# Patient Record
Sex: Male | Born: 1975 | Race: White | Hispanic: No | Smoking: Current every day smoker
Health system: Southern US, Community
[De-identification: ages and names within clinical notes are randomized; demographics above are authoritative.]

## PROBLEM LIST (undated history)

## (undated) DIAGNOSIS — Z8489 Family history of other specified conditions: Secondary | ICD-10-CM

## (undated) DIAGNOSIS — I639 Cerebral infarction, unspecified: Secondary | ICD-10-CM

## (undated) DIAGNOSIS — K5790 Diverticulosis of intestine, part unspecified, without perforation or abscess without bleeding: Secondary | ICD-10-CM

## (undated) DIAGNOSIS — G4730 Sleep apnea, unspecified: Secondary | ICD-10-CM

## (undated) DIAGNOSIS — I10 Essential (primary) hypertension: Secondary | ICD-10-CM

## (undated) DIAGNOSIS — N200 Calculus of kidney: Secondary | ICD-10-CM

## (undated) DIAGNOSIS — E119 Type 2 diabetes mellitus without complications: Secondary | ICD-10-CM

## (undated) HISTORY — PX: OTHER SURGICAL HISTORY: SHX169

---

## 2010-10-07 ENCOUNTER — Emergency Department (HOSPITAL_COMMUNITY)
Admission: EM | Admit: 2010-10-07 | Discharge: 2010-10-08 | Disposition: A | Discharge status: Home / Self Care | Payer: BC Managed Care – PPO | Attending: Emergency Medicine | Admitting: Emergency Medicine

## 2010-10-07 ENCOUNTER — Encounter: Payer: Self-pay | Admitting: *Deleted

## 2010-10-07 DIAGNOSIS — N200 Calculus of kidney: Secondary | ICD-10-CM | POA: Insufficient documentation

## 2010-10-07 DIAGNOSIS — F17200 Nicotine dependence, unspecified, uncomplicated: Secondary | ICD-10-CM | POA: Insufficient documentation

## 2010-10-07 DIAGNOSIS — R109 Unspecified abdominal pain: Secondary | ICD-10-CM | POA: Insufficient documentation

## 2010-10-07 DIAGNOSIS — N390 Urinary tract infection, site not specified: Secondary | ICD-10-CM | POA: Insufficient documentation

## 2010-10-07 DIAGNOSIS — R319 Hematuria, unspecified: Secondary | ICD-10-CM | POA: Insufficient documentation

## 2010-10-07 HISTORY — DX: Essential (primary) hypertension: I10

## 2010-10-07 LAB — URINALYSIS, ROUTINE W REFLEX MICROSCOPIC
Glucose, UA: NEGATIVE mg/dL
Specific Gravity, Urine: >1.03 — ABNORMAL HIGH (ref 1.005–1.030)

## 2010-10-07 LAB — URINE MICROSCOPIC-ADD ON

## 2010-10-07 NOTE — ED Notes (Signed)
C/o lower back pain x 1 week; reports onset of nausea, worsening right flank pain and hematuria today.

## 2010-10-08 ENCOUNTER — Emergency Department (HOSPITAL_COMMUNITY): Payer: BC Managed Care – PPO

## 2010-10-08 LAB — BASIC METABOLIC PANEL
Calcium: 9.3 mg/dL (ref 8.4–10.5)
GFR calc non Af Amer: >90 mL/min (ref >90)
Glucose, Bld: 103 mg/dL — ABNORMAL HIGH (ref 70–99)
Sodium: 139 mEq/L (ref 135–145)

## 2010-10-08 IMAGING — CT CT ABD-PELV W/O CM
2 of 4 series · 16 of 46 positions shown, 18 images · non-contrast
Comparison: None.

CLINICAL DATA: Flank pain, hematuria

CT ABDOMEN AND PELVIS WITHOUT CONTRAST
TECHNIQUE: Multidetector CT imaging of the abdomen and pelvis was
performed following the standard protocol without intravenous
contrast.

[Series 2: standard/full over (age)lbs 5.0 · axial · 0.90mm/px · z∈[-550,-120]mm · 13 of 94 slices shown, 15 images]
[im 4/94  soft-tissue]
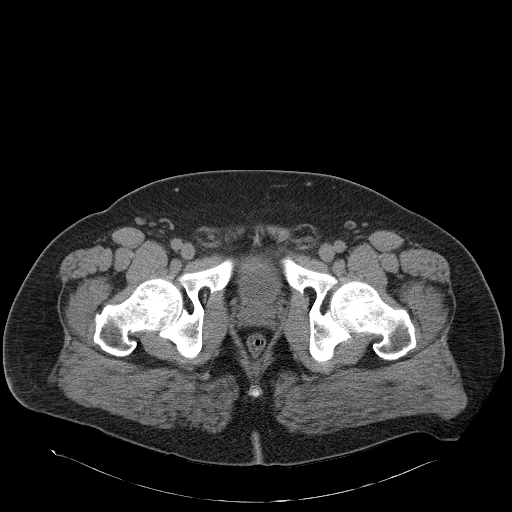
[im 4/94  bone]
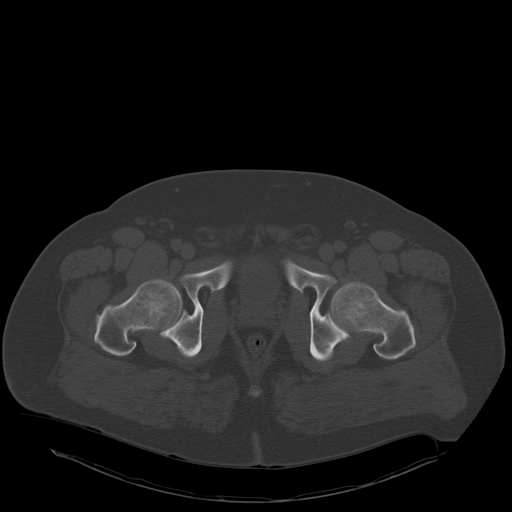
[im 12/94  soft-tissue]
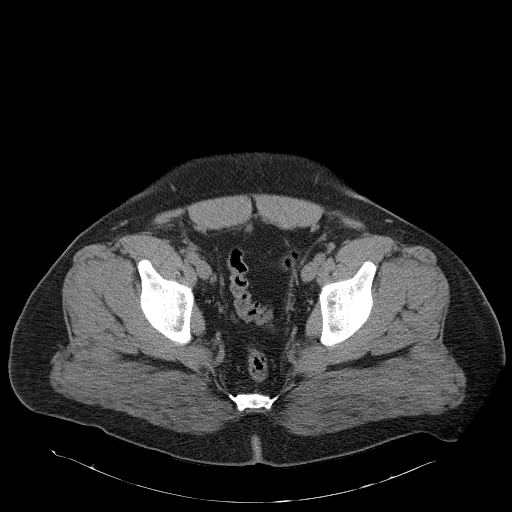
[im 19/94  soft-tissue]
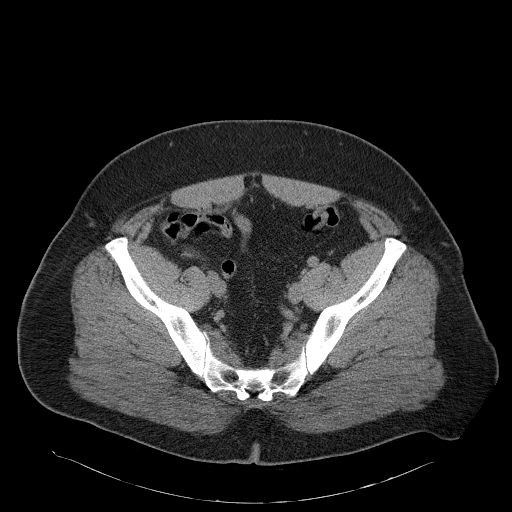
[im 27/94  soft-tissue]
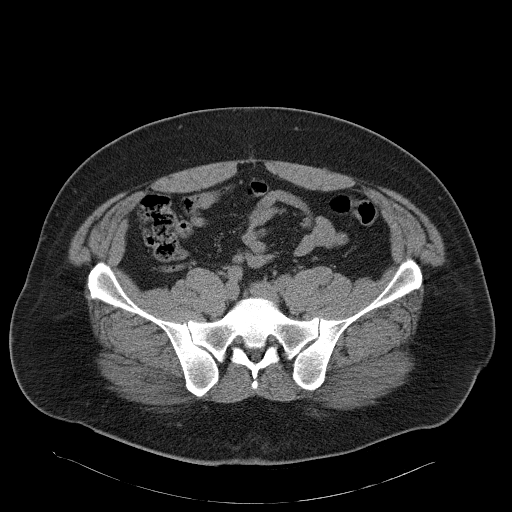
[im 34/94  soft-tissue]
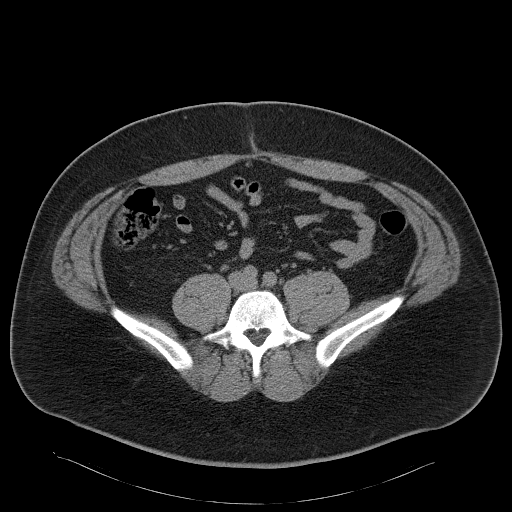
[im 41/94  soft-tissue]
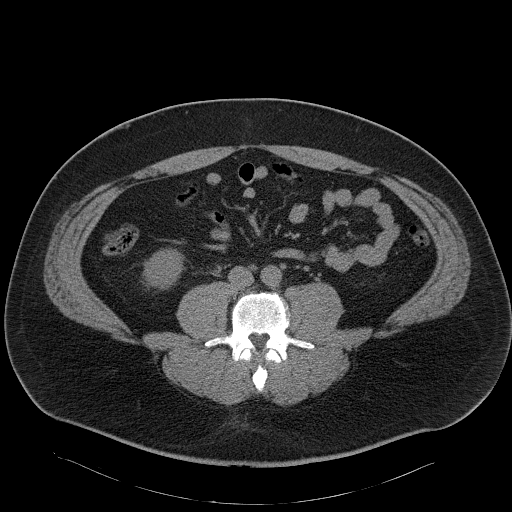
[im 49/94  soft-tissue]
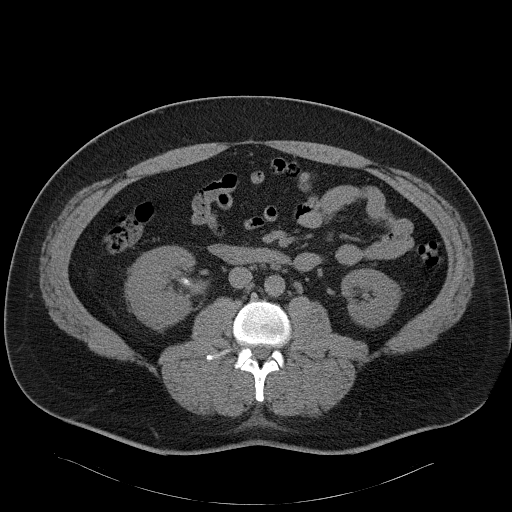
[im 53/94  soft-tissue]
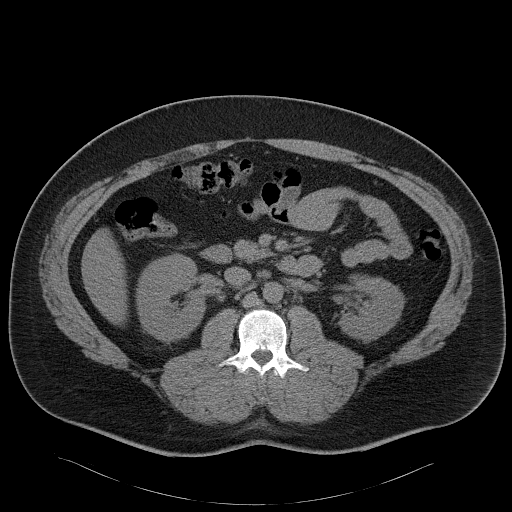
[im 60/94  soft-tissue]
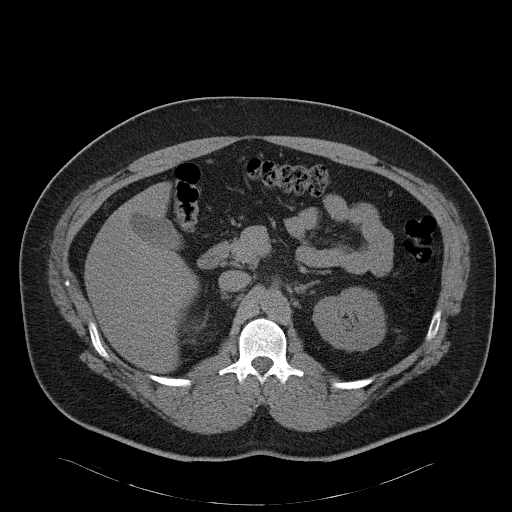
[im 60/94  bone]
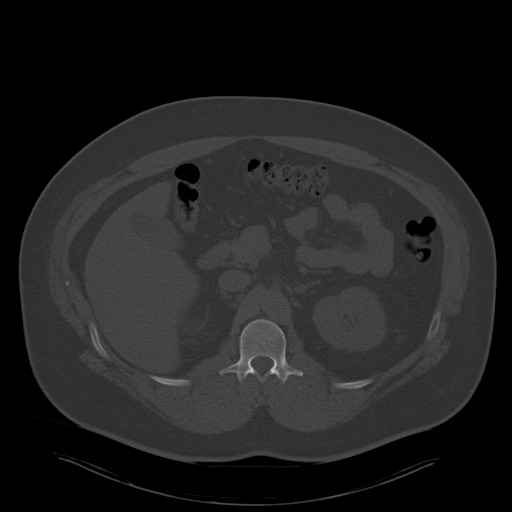
[im 67/94  soft-tissue]
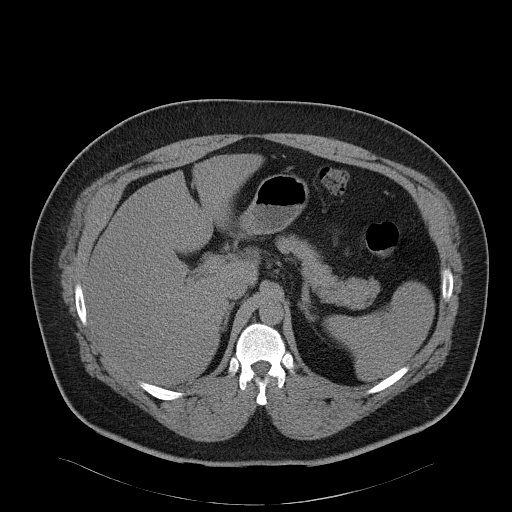
[im 75/94  soft-tissue]
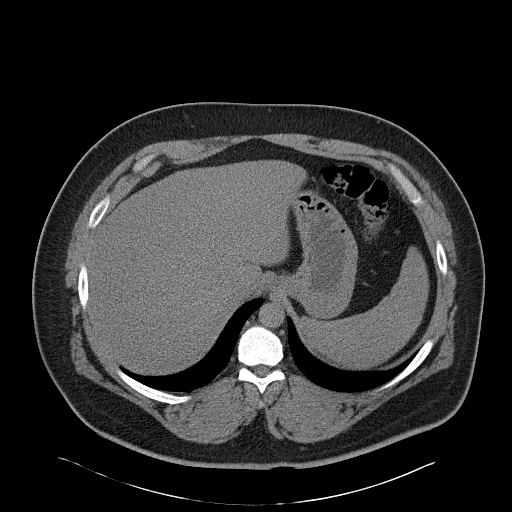
[im 82/94  soft-tissue]
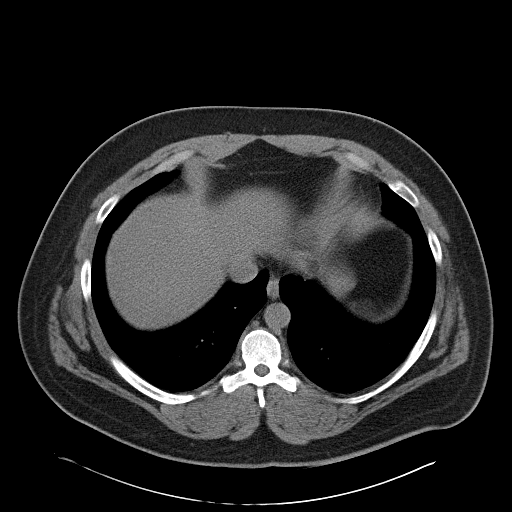
[im 90/94  soft-tissue]
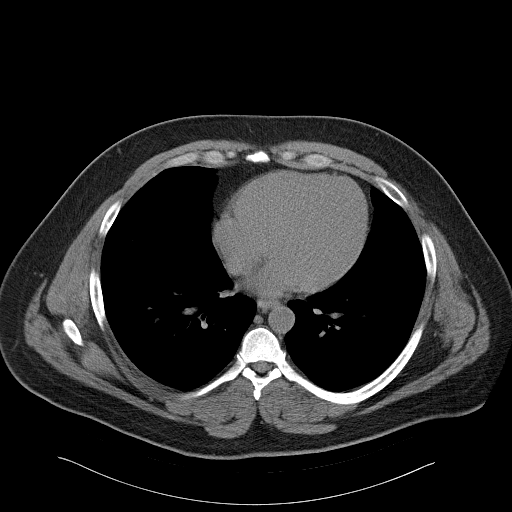

[Series 4: mpr coronal · coronal · 0.92mm/px · 3 of 123 slices shown]
[im 41/123  soft-tissue]
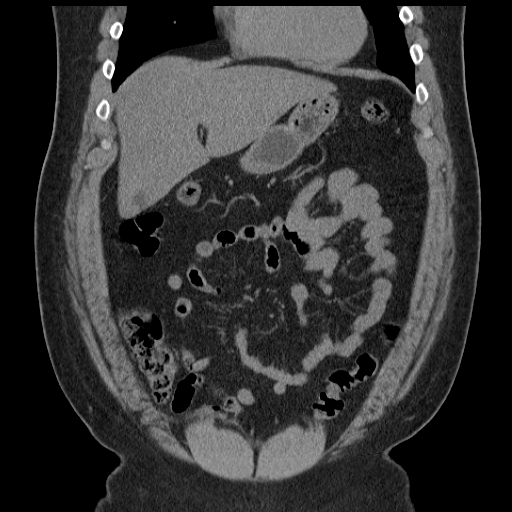
[im 55/123  soft-tissue]
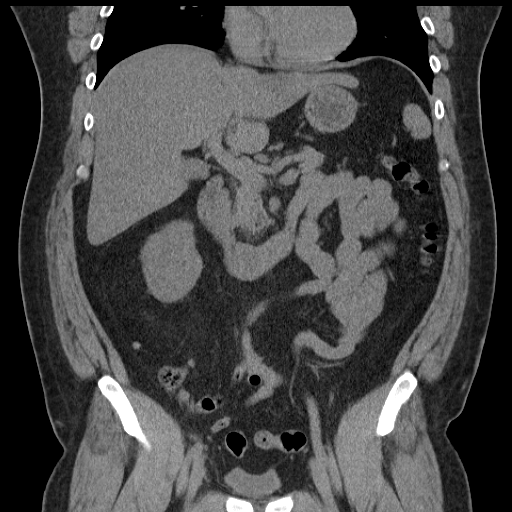
[im 68/123  soft-tissue]
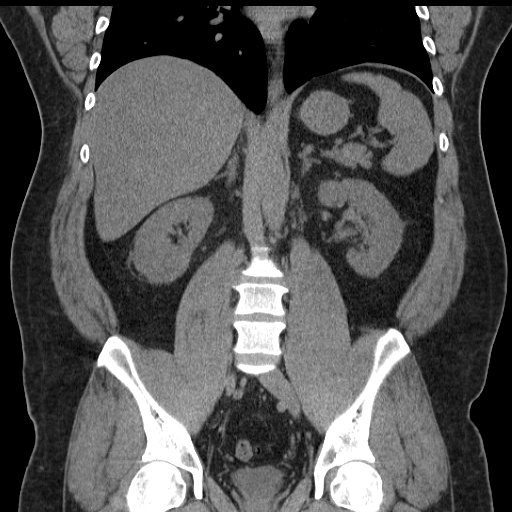

[16 of 46 positions shown; findings below may reference images not displayed]

FINDINGS: The lack of intravenous contrast limits the ability to evaluate
solid abdominal organs.

Normal hepatic contour.  No gallstones.  No ascites.

There is an approximately 1.6 x 0.7 x 0.8 cm stone (as measured in
greatest transverse, AP and craniocaudad dimensions respectively)
within the right renal pelvis with minimal amount of adjacent
stranding about the collecting system.  No definite right-sided
hydronephrosis.  There is an additional punctate (2 mm)
nonobstructing stone within the inferior calix of the right kidney
(image 51, series 2) as well as an approximately 7 mm stone within
the inferior calix of the left kidney (image 43).  No ureteral
stones.  No stones within the urinary bladder. Grossly symmetric
perinephric stranding.  Normal noncontrast appearance of the
bilateral adrenal glands, pancreas and spleen.

Colonic diverticulosis without evidence of diverticulitis.  The
bowel is otherwise normal in course and caliber.  Normal
noncontrast appearance of the appendix.  No pneumoperitoneum,
pneumatosis or portal venous gas.  Normal caliber abdominal aorta.
Scattered retroperitoneal lymph nodes are not enlarged by CT
criteria.  No retroperitoneal, mesenteric, pelvic or inguinal
lymphadenopathy.  Pelvic organs are normal.  No free fluid in the
pelvis.

Limited visualization of the lower thorax is negative for focal
airspace opacity.  Normal heart size.  No pericardial effusion.

No acute aggressive osseous abnormalities.  Small mesenteric fat
containing indirect left-sided inguinal hernia.
IMPRESSION: 1.  Approximately 1.6 cm stone within the right sided collecting
system with minimal amount of associated mesenteric stranding.  No
definite evidence of right-sided urinary obstruction.
2.  Additional smaller bilateral nonobstructing renal stones as
above.
3.  Colonic diverticulosis without evidence of diverticulitis.

## 2010-10-08 MED ORDER — CIPROFLOXACIN HCL 500 MG PO TABS: 500.0000 mg | ORAL_TABLET | Freq: Two times a day (BID) | ORAL | Status: AC

## 2010-10-08 MED ORDER — SODIUM CHLORIDE 0.9 % IV BOLUS (SEPSIS)
1000.0000 mL | Freq: Once | INTRAVENOUS | Status: AC
  Administered 2010-10-08: 1000 mL via INTRAVENOUS

## 2010-10-08 MED ORDER — IBUPROFEN 800 MG PO TABS: 800.0000 mg | ORAL_TABLET | Freq: Three times a day (TID) | ORAL | Status: AC

## 2010-10-08 MED ORDER — ONDANSETRON HCL 4 MG/2ML IJ SOLN
4.0000 mg | Freq: Once | INTRAMUSCULAR | Status: AC
  Administered 2010-10-08: 4 mg via INTRAVENOUS
  Filled 2010-10-08: qty 2

## 2010-10-08 MED ORDER — OXYCODONE-ACETAMINOPHEN 5-325 MG PO TABS
2.0000 | ORAL_TABLET | Freq: Four times a day (QID) | ORAL | Status: DC | PRN
  Administered 2010-10-08: 2 via ORAL
  Filled 2010-10-08: qty 2

## 2010-10-08 MED ORDER — CIPROFLOXACIN IN D5W 400 MG/200ML IV SOLN
400.0000 mg | Freq: Once | INTRAVENOUS | Status: AC
  Administered 2010-10-08: 400 mg via INTRAVENOUS
  Filled 2010-10-08: qty 200

## 2010-10-08 MED ORDER — KETOROLAC TROMETHAMINE 30 MG/ML IJ SOLN
30.0000 mg | Freq: Once | INTRAMUSCULAR | Status: AC
  Administered 2010-10-08: 30 mg via INTRAVENOUS
  Filled 2010-10-08: qty 1

## 2010-10-08 MED ORDER — TAMSULOSIN HCL 0.4 MG PO CAPS: 0.4000 mg | ORAL_CAPSULE | Freq: Two times a day (BID) | ORAL | Status: DC

## 2010-10-08 MED ORDER — SODIUM CHLORIDE 0.9 % IV SOLN: Freq: Once | INTRAVENOUS | Status: DC

## 2010-10-08 MED ORDER — OXYCODONE-ACETAMINOPHEN 5-325 MG PO TABS: 1.0000 | ORAL_TABLET | ORAL | Status: AC | PRN
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
## 2010-10-08 NOTE — ED Provider Notes (Signed)
History     CSN: [id number] Arrival date & time: 10/07/2010 11:54 PM  Chief Complaint  Patient presents with  . Flank Pain  . Hematuria    (Consider location/radiation/quality/duration/timing/severity/associated sxs/prior treatment) HPI Comments: Patient is a 35 year old male with a history of kidney stones. He states that in the past he has had have these removed by lithotripsy and basket removal by cystoscopy. He states that approximately 4 days ago he developed right lower back pain which has been more or less constant, radiating to the right lower quadrant and today developed hematuria. He has had nausea all day long but no vomiting, fever, dysuria. He denies any pain or swelling of his testicles or scrotum. Symptoms are moderate at this time though that fluctuate in intensity.  Patient is a 35 y.o. male presenting with flank pain and hematuria. The history is provided by the patient.  Flank Pain  Hematuria Associated symptoms include flank pain.    Past Medical History  Diagnosis Date  . Hypertension   . Renal disorder     kidney stones    Past Surgical History  Procedure Date  . Hernia repair     No family history on file.  History  Substance Use Topics  . Smoking status: Current Everyday Smoker -- 1.0 packs/day    Types: Cigarettes  . Smokeless tobacco: Not on file  . Alcohol Use: 3.6 oz/week    6 Cans of beer per week     6pk/week      Review of Systems  Genitourinary: Positive for hematuria and flank pain.  All other systems reviewed and are negative.    Allergies  Review of patient's allergies indicates no known allergies.  Home Medications  No current outpatient prescriptions on file.  BP 162/106  Pulse 73  Temp(Src) 98.6 F (37 C) (Oral)  Resp 20  Ht 5\' 10"  (1.778 m)  Wt 270 lb (122.471 kg)  BMI 38.74 kg/m2  SpO2 97%  Physical Exam  Nursing note and vitals reviewed. Constitutional: He appears well-developed and well-nourished. No  distress.  HENT:  Head: Normocephalic and atraumatic.  Mouth/Throat: Oropharynx is clear and moist. No oropharyngeal exudate.  Eyes: Conjunctivae and EOM are normal. Pupils are equal, round, and reactive to light. Right eye exhibits no discharge. Left eye exhibits no discharge. No scleral icterus.  Neck: Normal range of motion. Neck supple. No JVD present. No thyromegaly present.  Cardiovascular: Normal rate, regular rhythm, normal heart sounds and intact distal pulses.  Exam reveals no gallop and no friction rub.   No murmur heard. Pulmonary/Chest: Effort normal and breath sounds normal. No respiratory distress. He has no wheezes. He has no rales.  Abdominal: Soft. Bowel sounds are normal. He exhibits no distension and no mass. There is no tenderness.       CVA tenderness on the right  Genitourinary:       Normal penis, scrotum, S2 killer exam, no hernias felt  Musculoskeletal: Normal range of motion. He exhibits no edema and no tenderness.  Lymphadenopathy:    He has no cervical adenopathy.  Neurological: He is alert. Coordination normal.  Skin: Skin is warm and dry. No rash noted. No erythema.  Psychiatric: He has a normal mood and affect. His behavior is normal.    ED Course  Procedures (including critical care time)    MDM  Patient has signs and symptoms consistent with a kidney stone versus a pyelonephritis. Urinalysis shows large hemoglobin, 2 many bacteria. Nitrite negative, leukocyte negative. We'll  proceed with CT scan to rule out kidney stone. IV fluids, medications ordered  Results for orders placed during the hospital encounter of 10/07/10  URINALYSIS, ROUTINE W REFLEX MICROSCOPIC      Component Value Range   Color, Urine AMBER (*) YELLOW    Appearance CLEAR  CLEAR    Specific Gravity, Urine >1.030 (*) 1.005 - 1.030    pH 5.5  5.0 - 8.0    Glucose, UA NEGATIVE  NEGATIVE (mg/dL)   Hgb urine dipstick LARGE (*) NEGATIVE    Bilirubin Urine NEGATIVE  NEGATIVE     Ketones, ur NEGATIVE  NEGATIVE (mg/dL)   Protein, ur 045 (*) NEGATIVE (mg/dL)   Urobilinogen, UA 0.2  0.0 - 1.0 (mg/dL)   Nitrite NEGATIVE  NEGATIVE    Leukocytes, UA NEGATIVE  NEGATIVE   URINE MICROSCOPIC-ADD ON      Component Value Range   Squamous Epithelial / LPF RARE  RARE    WBC, UA 3-6  <3 (WBC/hpf)   RBC / HPF TOO NUMEROUS TO COUNT  <3 (RBC/hpf)   Bacteria, UA MANY (*) RARE    Ct Abdomen Pelvis Wo Contrast  10/08/2010  *RADIOLOGY REPORT*  Clinical Data: Flank pain, hematuria  CT ABDOMEN AND PELVIS WITHOUT CONTRAST  Technique:  Multidetector CT imaging of the abdomen and pelvis was performed following the standard protocol without intravenous contrast.  Comparison: None.  Findings:  The lack of intravenous contrast limits the ability to evaluate solid abdominal organs.  Normal hepatic contour.  No gallstones.  No ascites.  There is an approximately 1.6 x 0.7 x 0.8 cm stone (as measured in greatest transverse, AP and craniocaudad dimensions respectively) within the right renal pelvis with minimal amount of adjacent stranding about the collecting system.  No definite right-sided hydronephrosis.  There is an additional punctate (2 mm) nonobstructing stone within the inferior calix of the right kidney (image 51, series 2) as well as an approximately 7 mm stone within the inferior calix of the left kidney (image 43).  No ureteral stones.  No stones within the urinary bladder. Grossly symmetric perinephric stranding.  Normal noncontrast appearance of the bilateral adrenal glands, pancreas and spleen.  Colonic diverticulosis without evidence of diverticulitis.  The bowel is otherwise normal in course and caliber.  Normal noncontrast appearance of the appendix.  No pneumoperitoneum, pneumatosis or portal venous gas.  Normal caliber abdominal aorta. Scattered retroperitoneal lymph nodes are not enlarged by CT criteria.  No retroperitoneal, mesenteric, pelvic or inguinal lymphadenopathy.  Pelvic organs are  normal.  No free fluid in the pelvis.  Limited visualization of the lower thorax is negative for focal airspace opacity.  Normal heart size.  No pericardial effusion.  No acute aggressive osseous abnormalities.  Small mesenteric fat containing indirect left-sided inguinal hernia.  IMPRESSION: 1.  Approximately 1.6 cm stone within the right sided collecting system with minimal amount of associated mesenteric stranding.  No definite evidence of right-sided urinary obstruction. 2.  Additional smaller bilateral nonobstructing renal stones as above. 3.  Colonic diverticulosis without evidence of diverticulitis.  Original Report Authenticated By: Waynard Reeds, M.D.   CT reviewed - large stone - will need urology f/u - no evidence of obstruction.  UA reveals likely UTI with stone - abx started - Urology f/u strict recommended - pt has Urologist out of town and agrees to same.  Patient appears well, no fever or tachycardia. Medications given in the emergency department. Agrees for close followup. Declined a work note  Vida Roller, MD 10/08/10 [phone]

## 2010-10-08 NOTE — ED Notes (Signed)
Back pain since Thursday, nausea with dry heaves.  Hx of kidney stones.  Hematuria.  Alert, talking

## 2010-10-09 LAB — URINE CULTURE

## 2011-10-18 ENCOUNTER — Encounter (HOSPITAL_COMMUNITY): Payer: Self-pay | Admitting: Emergency Medicine

## 2011-10-18 ENCOUNTER — Emergency Department (HOSPITAL_COMMUNITY): Payer: BC Managed Care – PPO

## 2011-10-18 ENCOUNTER — Emergency Department (HOSPITAL_COMMUNITY)
Admission: EM | Admit: 2011-10-18 | Discharge: 2011-10-18 | Disposition: A | Discharge status: Home / Self Care | Payer: BC Managed Care – PPO | Attending: Emergency Medicine | Admitting: Emergency Medicine

## 2011-10-18 DIAGNOSIS — S39012A Strain of muscle, fascia and tendon of lower back, initial encounter: Secondary | ICD-10-CM

## 2011-10-18 DIAGNOSIS — S7001XA Contusion of right hip, initial encounter: Secondary | ICD-10-CM

## 2011-10-18 DIAGNOSIS — W010XXA Fall on same level from slipping, tripping and stumbling without subsequent striking against object, initial encounter: Secondary | ICD-10-CM | POA: Insufficient documentation

## 2011-10-18 DIAGNOSIS — S7000XA Contusion of unspecified hip, initial encounter: Secondary | ICD-10-CM | POA: Insufficient documentation

## 2011-10-18 DIAGNOSIS — M51379 Other intervertebral disc degeneration, lumbosacral region without mention of lumbar back pain or lower extremity pain: Secondary | ICD-10-CM | POA: Insufficient documentation

## 2011-10-18 DIAGNOSIS — S20229A Contusion of unspecified back wall of thorax, initial encounter: Secondary | ICD-10-CM | POA: Insufficient documentation

## 2011-10-18 DIAGNOSIS — M5137 Other intervertebral disc degeneration, lumbosacral region: Secondary | ICD-10-CM | POA: Insufficient documentation

## 2011-10-18 DIAGNOSIS — I10 Essential (primary) hypertension: Secondary | ICD-10-CM | POA: Insufficient documentation

## 2011-10-18 DIAGNOSIS — S300XXA Contusion of lower back and pelvis, initial encounter: Secondary | ICD-10-CM | POA: Insufficient documentation

## 2011-10-18 IMAGING — CR DG LUMBAR SPINE COMPLETE 4+V
5 series · 5 of 5 positions shown · non-contrast
Comparison: [DATE].

CLINICAL DATA: Fall.  Lumbar pain.  Bruising.

LUMBAR SPINE - COMPLETE 4+ VIEW

[view not recorded (1 of 5)]
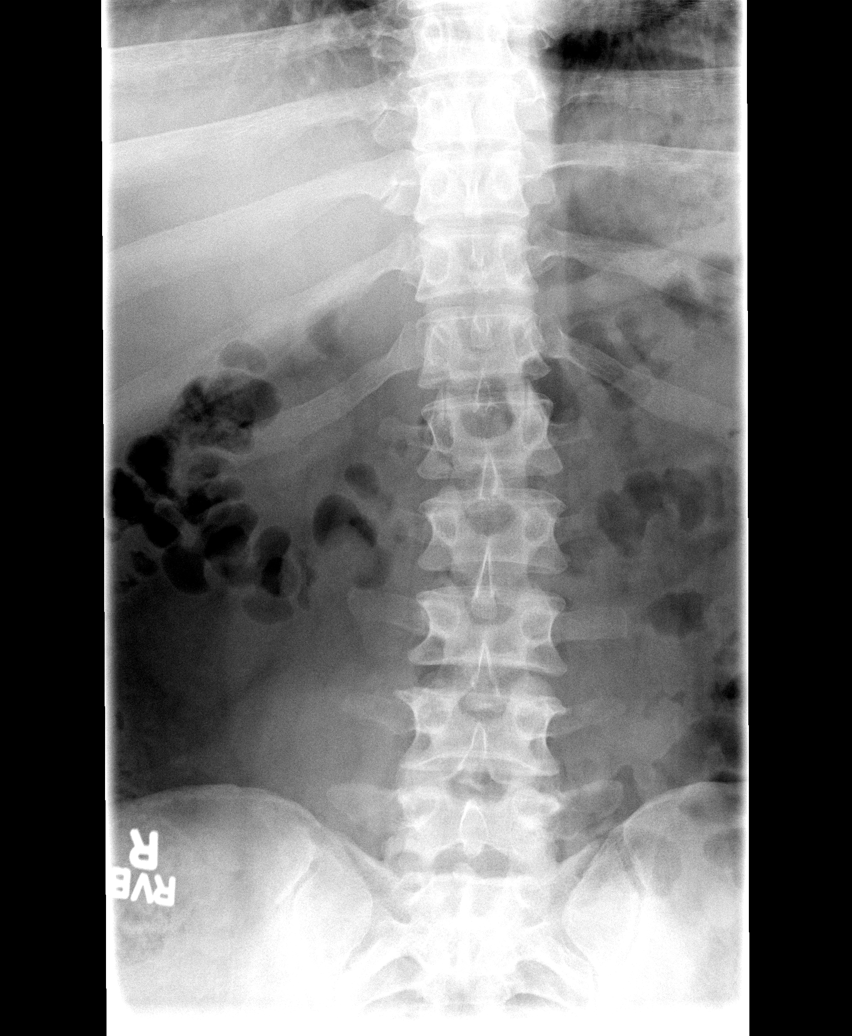

[view not recorded (2 of 5)]
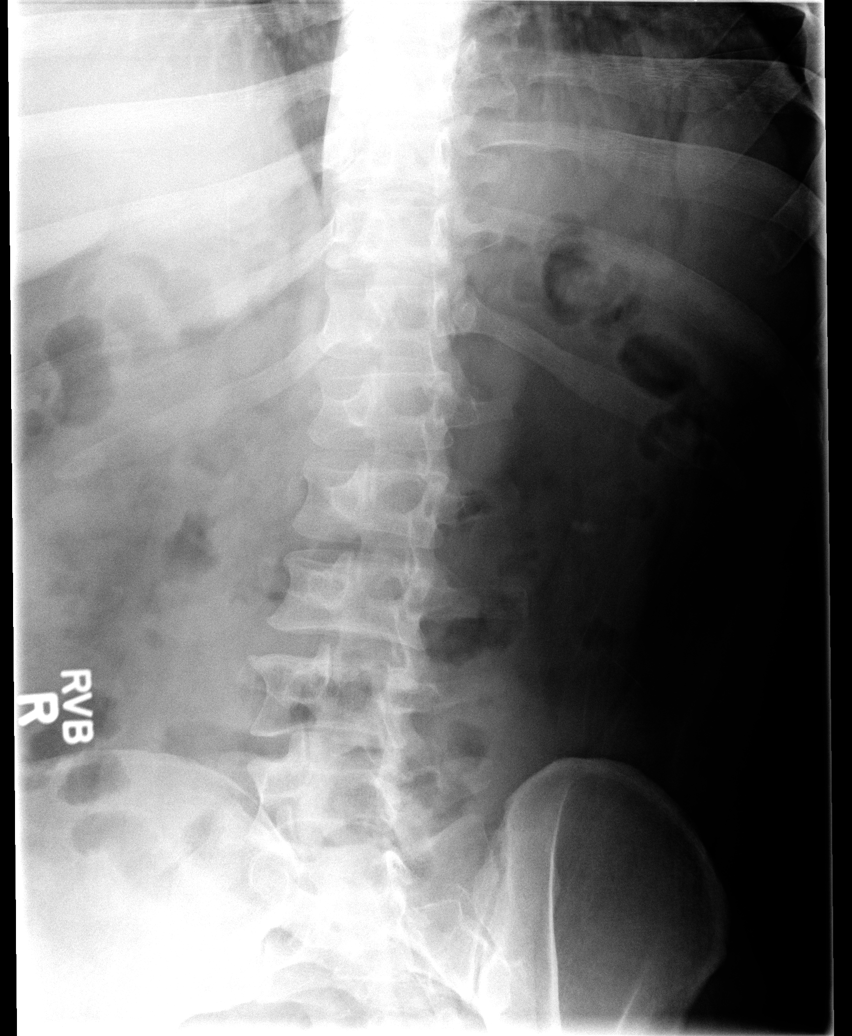

[view not recorded (3 of 5)]
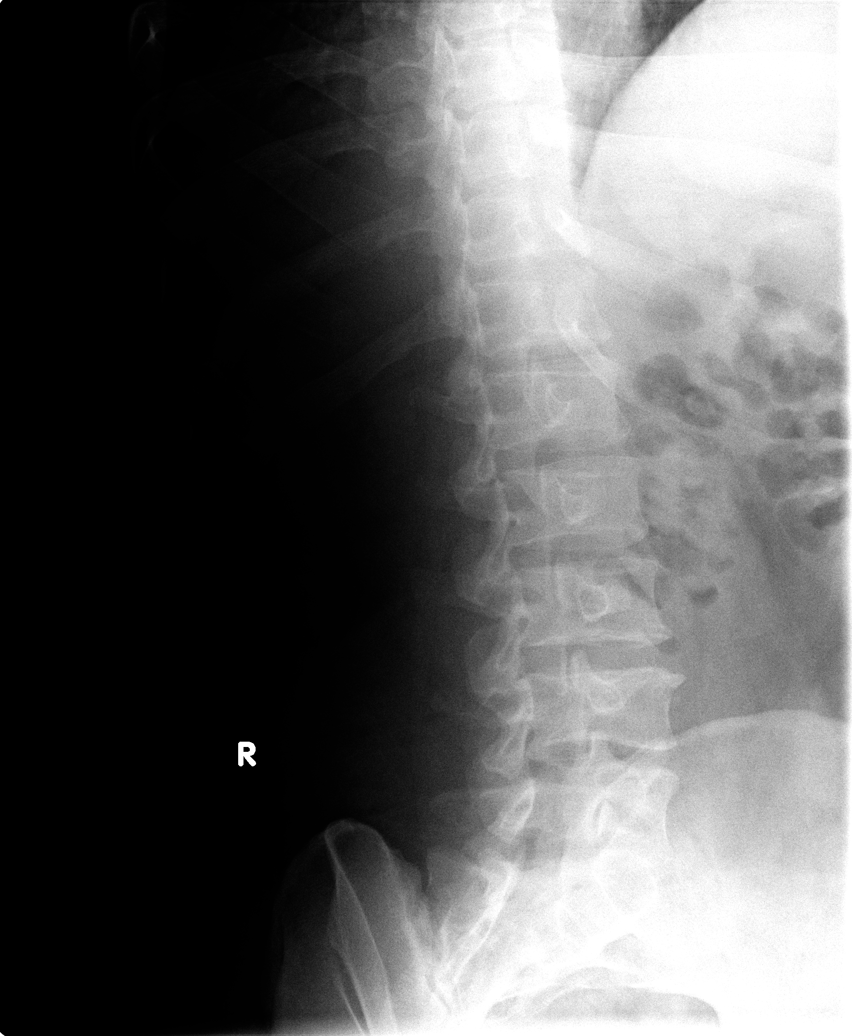

[view not recorded (4 of 5)]
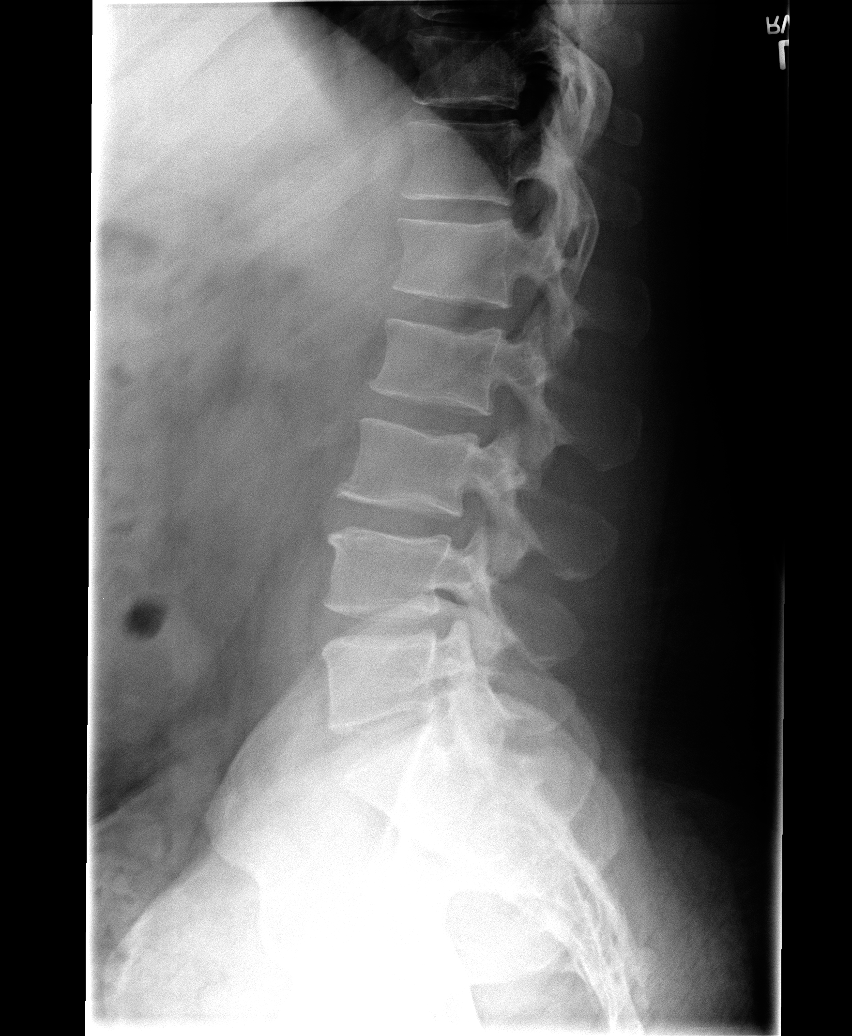

[view not recorded (5 of 5)]
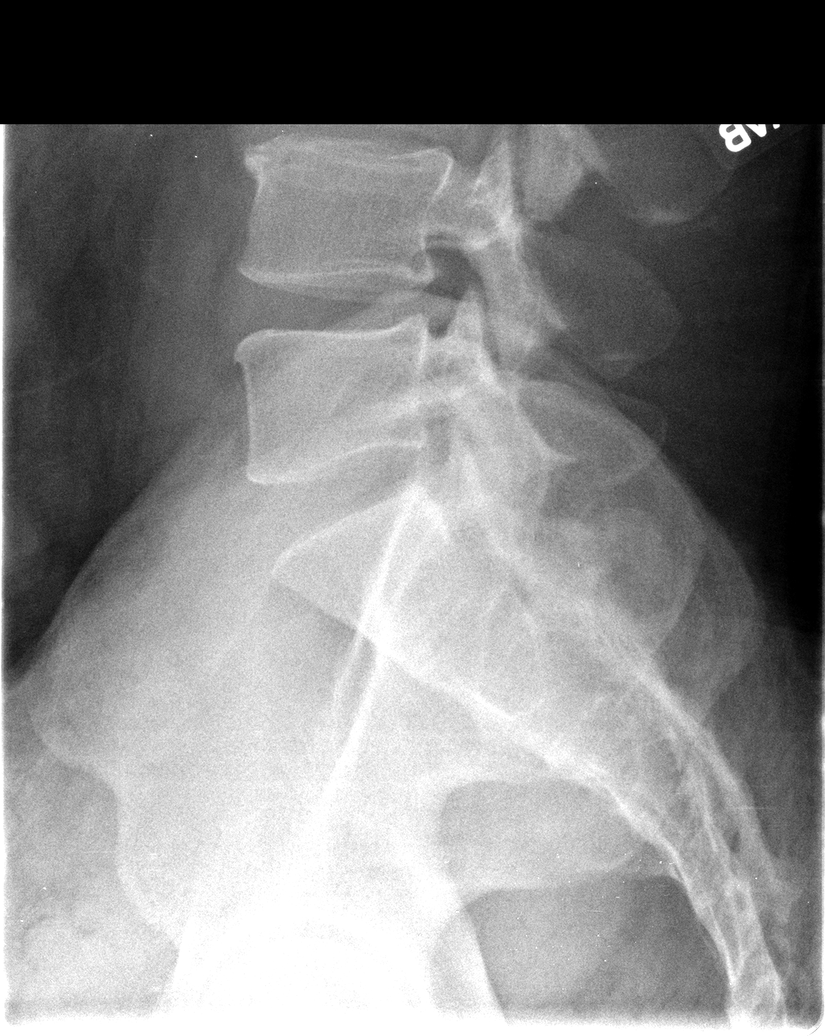

[5 of 5 positions shown; findings below may reference images not displayed]

FINDINGS: Mild levoconvex curvature of the lumbar spine may be
positional secondary to spasm.  Vertebral body height is preserved.
Partially visualized left renal calculus noted measuring about 1
cm.  There are no pars defects.  Vertebral body height is
preserved.

L3-L4 degenerative disc disease with endplate spurring appears
similar to the prior CT [DATE].
IMPRESSION: 1.  No acute osseous abnormality.
2.  Mild levoconvex curvature may be positional secondary spasm.
3.  Mild lumbar spondylosis.
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
## 2011-10-18 MED ORDER — OXYCODONE-ACETAMINOPHEN 5-325 MG PO TABS
1.0000 | ORAL_TABLET | Freq: Once | ORAL | Status: AC
  Administered 2011-10-18: 1 via ORAL
  Filled 2011-10-18: qty 1

## 2011-10-18 MED ORDER — KETOROLAC TROMETHAMINE 10 MG PO TABS
10.0000 mg | ORAL_TABLET | Freq: Once | ORAL | Status: AC
  Administered 2011-10-18: 10 mg via ORAL
  Filled 2011-10-18: qty 1

## 2011-10-18 MED ORDER — HYDROCODONE-ACETAMINOPHEN 7.5-500 MG PO TABS: 1.0000 | ORAL_TABLET | ORAL | Status: DC | PRN
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
## 2011-10-18 NOTE — ED Notes (Signed)
Pt c/o lower back/right hip pain after falling on Friday.

## 2011-10-18 NOTE — ED Provider Notes (Signed)
Medical screening examination/treatment/procedure(s) were performed by non-physician practitioner and as supervising physician I was immediately available for consultation/collaboration.  Alessandro Griep, MD 10/18/11 2348 
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
## 2012-11-09 ENCOUNTER — Emergency Department (HOSPITAL_COMMUNITY)
Admission: EM | Admit: 2012-11-09 | Discharge: 2012-11-09 | Disposition: A | Discharge status: Home / Self Care | Payer: BC Managed Care – PPO | Attending: Emergency Medicine | Admitting: Emergency Medicine

## 2012-11-09 ENCOUNTER — Encounter (HOSPITAL_COMMUNITY): Payer: Self-pay | Admitting: Emergency Medicine

## 2012-11-09 DIAGNOSIS — F17200 Nicotine dependence, unspecified, uncomplicated: Secondary | ICD-10-CM | POA: Insufficient documentation

## 2012-11-09 DIAGNOSIS — Z87442 Personal history of urinary calculi: Secondary | ICD-10-CM | POA: Insufficient documentation

## 2012-11-09 DIAGNOSIS — Z8719 Personal history of other diseases of the digestive system: Secondary | ICD-10-CM | POA: Insufficient documentation

## 2012-11-09 DIAGNOSIS — I10 Essential (primary) hypertension: Secondary | ICD-10-CM | POA: Insufficient documentation

## 2012-11-09 DIAGNOSIS — Z79899 Other long term (current) drug therapy: Secondary | ICD-10-CM | POA: Insufficient documentation

## 2012-11-09 DIAGNOSIS — R609 Edema, unspecified: Secondary | ICD-10-CM | POA: Insufficient documentation

## 2012-11-09 MED ORDER — FUROSEMIDE 40 MG PO TABS
40.0000 mg | ORAL_TABLET | Freq: Once | ORAL | Status: AC
  Administered 2012-11-09: 40 mg via ORAL
  Filled 2012-11-09: qty 1

## 2012-11-09 MED ORDER — FUROSEMIDE 40 MG PO TABS: 40.0000 mg | ORAL_TABLET | Freq: Every day | ORAL | Status: DC

## 2012-11-09 NOTE — ED Notes (Signed)
Pt was discharged from the hosp. 2 weeks, and his meds were changed. Now on lisinopril and atenolol.  Legs and hands are swollen.  Thinks he may need a fluid pill.  Denies any sob, no cough or congestion. No fever.    Swelling is worse at night.

## 2012-11-09 NOTE — ED Provider Notes (Signed)
CSN: [id number]     Arrival date & time 11/09/12  1207 History  This chart was scribed for Dylan Hutching, MD by Quintella Reichert, ED scribe.  This patient was seen in room [facility] and the patient's care was started at 2:49 PM.   Chief Complaint  Patient presents with  . Leg Swelling    The history is provided by the patient. No language interpreter was used.    HPI Comments: Dearl Rudden is a 37 y.o. male with h/o HTN who presents to the Emergency Department complaining of progressively-worsening mild-to-moderate bilateral leg and hand swelling.  Pt states that his swelling worsens throughout the day.  He notes that he used to be on a fluid pill (cannot remember the name) but has not been on this in some time.  He has an appointment at a clinic in [place] tomorrow but was advised to come to the ED today.  He is on atenolol 50mg  2x/day which he has been taking as instructed.  He denies fever, CP, SOB, cough, or congestion.  Additional medical history includes kidney stones and gastric ulcer.  He denies any other chronic medical conditions or regular medication usage.  He is a current every-day smoker and moderate social drinker.     Past Medical History  Diagnosis Date  . Hypertension   . Renal disorder     kidney stones  . Gastric ulcer     Past Surgical History  Procedure Laterality Date  . Hernia repair      No family history on file.   History  Substance Use Topics  . Smoking status: Current Every Day Smoker -- 1.00 packs/day    Types: Cigarettes  . Smokeless tobacco: Not on file  . Alcohol Use: 0.0 oz/week     Review of Systems A complete 10 system review of systems was obtained and all systems are negative except as noted in the HPI and PMH.    Allergies  Review of patient's allergies indicates no known allergies.  Home Medications   Current Outpatient Rx  Name  Route  Sig  Dispense  Refill  . HYDROcodone-acetaminophen (LORTAB 7.5) 7.5-500 MG per tablet  Oral   Take 1 tablet by mouth every 4 (four) hours as needed for pain.   15 tablet   0   . Tamsulosin HCl (FLOMAX) 0.4 MG CAPS   Oral   Take 1 capsule (0.4 mg total) by mouth 2 (two) times daily.   10 capsule   0    BP 148/103  Pulse 77  Temp(Src) 97.7 F (36.5 C) (Oral)  Resp 20  Ht 5\' 11"  (1.803 m)  Wt 260 lb (117.935 kg)  BMI 36.28 kg/m2  SpO2 99%  Physical Exam  Nursing note and vitals reviewed. Constitutional: He is oriented to person, place, and time. He appears well-developed and well-nourished.  HENT:  Head: Normocephalic and atraumatic.  Eyes: Conjunctivae and EOM are normal. Pupils are equal, round, and reactive to light.  Neck: Normal range of motion. Neck supple.  Cardiovascular: Normal rate, regular rhythm and normal heart sounds.   Pulmonary/Chest: Effort normal and breath sounds normal.  Abdominal: Soft. Bowel sounds are normal.  Musculoskeletal: Normal range of motion. He exhibits edema.  2-3+ peripheral edema  Neurological: He is alert and oriented to person, place, and time.  Skin: Skin is warm and dry.  Psychiatric: He has a normal mood and affect.    ED Course  Procedures (including critical care time)  DIAGNOSTIC STUDIES: Oxygen  Saturation is 99% on room air, normal by my interpretation.    COORDINATION OF CARE: 2:55 PM-Discussed treatment plan with pt at bedside and pt agreed to plan.    Labs Review Labs Reviewed - No data to display  Imaging Review No results found.  EKG Interpretation   None       MDM  No diagnosis found.   Patient has been on a diuretic in the past for both his blood pressure and his peripheral edema. Will Rx Lasix 40 mg daily for same. Patient has primary care followup.  I personally performed the services described in this documentation, which was scribed in my presence. The recorded information has been reviewed and is accurate.    Dylan Hutching, MD 11/09/12 [phone]

## 2013-04-08 ENCOUNTER — Emergency Department (HOSPITAL_COMMUNITY): Payer: Medicaid Other

## 2013-04-08 ENCOUNTER — Encounter (HOSPITAL_COMMUNITY): Payer: Self-pay | Admitting: Emergency Medicine

## 2013-04-08 ENCOUNTER — Emergency Department (HOSPITAL_COMMUNITY)
Admission: EM | Admit: 2013-04-08 | Discharge: 2013-04-08 | Disposition: A | Discharge status: Home / Self Care | Payer: Medicaid Other | Attending: Emergency Medicine | Admitting: Emergency Medicine

## 2013-04-08 DIAGNOSIS — Z7982 Long term (current) use of aspirin: Secondary | ICD-10-CM | POA: Insufficient documentation

## 2013-04-08 DIAGNOSIS — Z87442 Personal history of urinary calculi: Secondary | ICD-10-CM | POA: Insufficient documentation

## 2013-04-08 DIAGNOSIS — Z8719 Personal history of other diseases of the digestive system: Secondary | ICD-10-CM | POA: Insufficient documentation

## 2013-04-08 DIAGNOSIS — M549 Dorsalgia, unspecified: Secondary | ICD-10-CM | POA: Insufficient documentation

## 2013-04-08 DIAGNOSIS — R0602 Shortness of breath: Secondary | ICD-10-CM | POA: Insufficient documentation

## 2013-04-08 DIAGNOSIS — Z79899 Other long term (current) drug therapy: Secondary | ICD-10-CM | POA: Insufficient documentation

## 2013-04-08 DIAGNOSIS — R0789 Other chest pain: Secondary | ICD-10-CM

## 2013-04-08 DIAGNOSIS — I10 Essential (primary) hypertension: Secondary | ICD-10-CM | POA: Insufficient documentation

## 2013-04-08 DIAGNOSIS — R071 Chest pain on breathing: Secondary | ICD-10-CM | POA: Insufficient documentation

## 2013-04-08 DIAGNOSIS — Z8739 Personal history of other diseases of the musculoskeletal system and connective tissue: Secondary | ICD-10-CM | POA: Insufficient documentation

## 2013-04-08 DIAGNOSIS — F17200 Nicotine dependence, unspecified, uncomplicated: Secondary | ICD-10-CM | POA: Insufficient documentation

## 2013-04-08 DIAGNOSIS — R059 Cough, unspecified: Secondary | ICD-10-CM | POA: Insufficient documentation

## 2013-04-08 DIAGNOSIS — R05 Cough: Secondary | ICD-10-CM | POA: Insufficient documentation

## 2013-04-08 HISTORY — DX: Calculus of kidney: N20.0

## 2013-04-08 HISTORY — DX: Diverticulosis of intestine, part unspecified, without perforation or abscess without bleeding: K57.90

## 2013-04-08 LAB — I-STAT CHEM 8, ED
BUN: 9 mg/dL (ref 6–23)
CREATININE: 0.8 mg/dL (ref 0.50–1.35)
Calcium, Ion: 1.24 mmol/L — ABNORMAL HIGH (ref 1.12–1.23)
Chloride: 105 mEq/L (ref 96–112)
GLUCOSE: 139 mg/dL — AB (ref 70–99)
HEMATOCRIT: 47 % (ref 39.0–52.0)
HEMOGLOBIN: 16 g/dL (ref 13.0–17.0)
POTASSIUM: 4 meq/L (ref 3.7–5.3)
SODIUM: 142 meq/L (ref 137–147)
TCO2: 25 mmol/L (ref 0–100)

## 2013-04-08 IMAGING — CT CT ANGIO CHEST
2 of 6 series · 6 of 36 positions shown · IV contrast (Omnipaque 300)
Comparison: None.

CLINICAL DATA: Chest pain

EXAM:
CT ANGIOGRAPHY CHEST WITH CONTRAST
TECHNIQUE: Multidetector CT imaging of the chest was performed using the
standard protocol during bolus administration of intravenous
contrast. Multiplanar CT image reconstructions and MIPs were
obtained to evaluate the vascular anatomy.
CONTRAST:  100mL OMNIPAQUE IOHEXOL 350 MG/ML SOLN

[Series 5: pe 3.0 b40f · axial · 0.77mm/px · z∈[-277,-97]mm · 5 of 90 slices shown]
[im 15/90  lung]
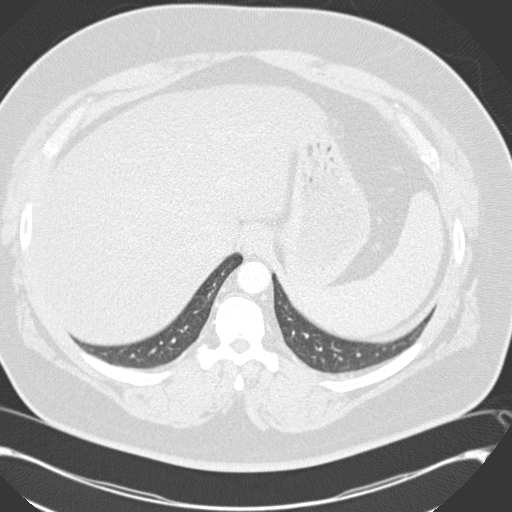
[im 30/90  mediastinal]
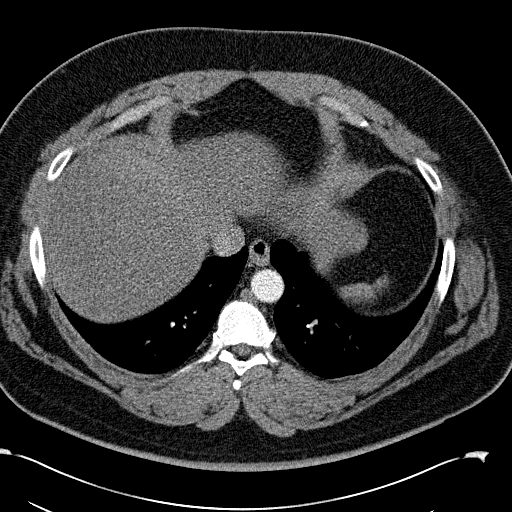
[im 45/90  lung]
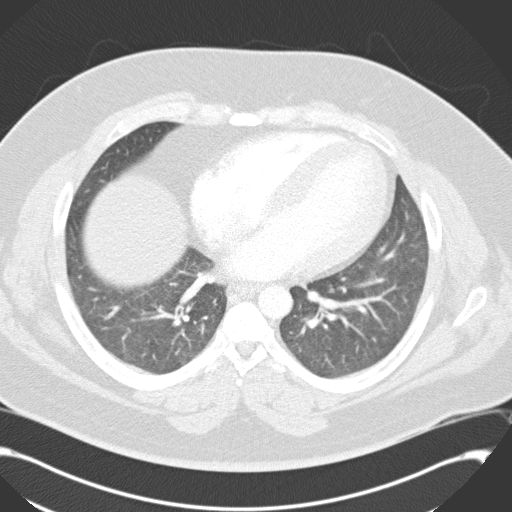
[im 60/90  mediastinal]
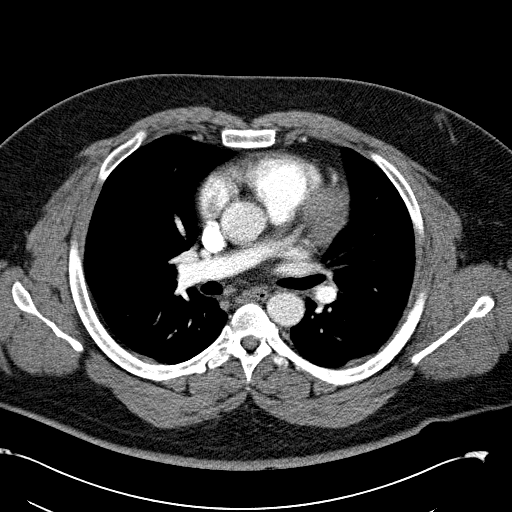
[im 75/90  lung]
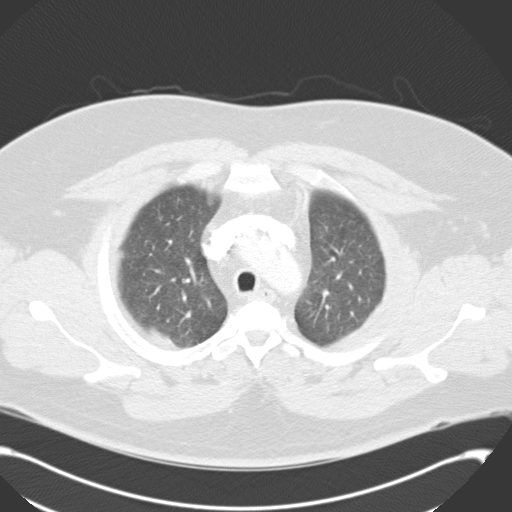

[Series 7: mpr coronal pe 3mm · coronal · 0.54mm/px · 1 of 100 slices shown]
[im 50/100  mediastinal]
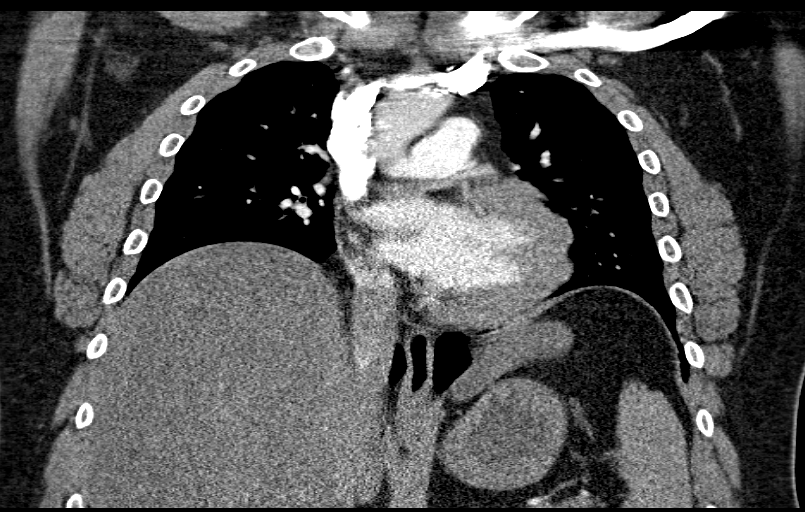

[6 of 36 positions shown; findings below may reference images not displayed]

FINDINGS: There is no demonstrable pulmonary embolus. There is no thoracic
aortic aneurysm or dissection.

The lungs are clear.

There is no appreciable thoracic adenopathy. The pericardium is not
appreciably thickened.

In the visualized upper abdomen, there is fatty change in the liver.
Visualized upper abdomen otherwise appears normal. There are no
blastic or lytic bone lesions.

Review of the MIP images confirms the above findings.
IMPRESSION: No demonstrable pulmonary embolus. Lungs clear. Fatty liver present.

## 2013-04-08 MED ORDER — HYDROCODONE-ACETAMINOPHEN 5-325 MG PO TABS: ORAL_TABLET | ORAL | Status: DC

## 2013-04-08 MED ORDER — OXYCODONE-ACETAMINOPHEN 5-325 MG PO TABS
2.0000 | ORAL_TABLET | Freq: Once | ORAL | Status: AC
  Administered 2013-04-08: 2 via ORAL
  Filled 2013-04-08: qty 2

## 2013-04-08 MED ORDER — METHOCARBAMOL 500 MG PO TABS: 1000.0000 mg | ORAL_TABLET | Freq: Four times a day (QID) | ORAL | Status: DC | PRN

## 2013-04-08 MED ORDER — IOHEXOL 350 MG/ML SOLN
100.0000 mL | Freq: Once | INTRAVENOUS | Status: AC | PRN
  Administered 2013-04-08: 100 mL via INTRAVENOUS

## 2013-04-08 MED ORDER — NAPROXEN 250 MG PO TABS: 250.0000 mg | ORAL_TABLET | Freq: Two times a day (BID) | ORAL | Status: DC

## 2013-04-08 NOTE — Discharge Instructions (Signed)
°Emergency Department Resource Guide °1) Find a Doctor and Pay Out of Pocket °Although you won't have to find out who is covered by your insurance plan, it is a good idea to ask around and get recommendations. You will then need to call the office and see if the doctor you have chosen will accept you as a new patient and what types of options they offer for patients who are self-pay. Some doctors offer discounts or will set up payment plans for their patients who do not have insurance, but you will need to ask so you aren't surprised when you get to your appointment. ° °2) Contact Your Local Health Department °Not all health departments have doctors that can see patients for sick visits, but many do, so it is worth a call to see if yours does. If you don't know where your local health department is, you can check in your phone book. The CDC also has a tool to help you locate your state's health department, and many state websites also have listings of all of their local health departments. ° °3) Find a Walk-in Clinic °If your illness is not likely to be very severe or complicated, you may want to try a walk in clinic. These are popping up all over the country in pharmacies, drugstores, and shopping centers. They're usually staffed by nurse practitioners or physician assistants that have been trained to treat common illnesses and complaints. They're usually fairly quick and inexpensive. However, if you have serious medical issues or chronic medical problems, these are probably not your best option. ° °No Primary Care Doctor: °- Call [facility] at  832-8000 - they can help you locate a primary care doctor that  accepts your insurance, provides certain services, etc. °- Physician Referral Service- 1-800-533-3463 ° °Chronic Pain Problems: °Organization         Address  Phone   Notes  °[facility]  (336) 297-2271 Patients need to be referred by their primary care doctor.  ° °Medication  Assistance: °Organization         Address  Phone   Notes  °[place] [facility] [address] °[place], [place] [address] (336) 641-8030 --Must be a resident of [place] °-- Must have NO insurance coverage whatsoever (no Medicaid/ Medicare, etc.) °-- The pt. MUST have a primary care doctor that directs their care regularly and follows them in the community °  °[facility]  (866) 331-1348   °[employer]  (888) 892-1162   ° °Agencies that provide inexpensive medical care: °Organization         Address  Phone   Notes  °[facility]  (336) 832-8035   °[facility]    (336) 832-7272   °[facility] [address] °[place], [place] [address] (336) 832-4777   °[facility] [address], °[place] (336) 271-4999   °[facility]    (336) 373-0678   °[facility]    (336) 272-1050   °[facility] ° [address], [place] Phone:  (336) 832-4444, Fax:  (336) 832-4440 Hours of Operation:  9 am - 6 pm, M-F.  Also accepts Medicaid/Medicare and self-pay.  °[facility] ° [address],  Phone: (336) 832-3150, Fax: (336) 832-3151. Hours of Operation:  8:30 am - 5:30 pm, M-F.  Also accepts Medicaid and self-pay.  °[facility] [place] [address], [place] Phone: (336) 878-6027   °[facility] [address], [place], [place] (336)723-1848, Ext. 123 Mondays & Thursdays: 7-9 AM.  First 15 patients are seen on a first come, first serve basis. °  ° °Medicaid-accepting [place] Providers: ° °Organization         Address  Phone   Notes  °[facility] [address], [place] (336) 641-2100 Also accepts self-pay patients.  °[facility] [address], [place] ° (336) 856-9996   °[facility] [address], [place]  (336) 288-8857   °[facility] [address] [place] [address], [place] (336) 299-7000   °Veita Bland [address], [place]  ° (336) 373-1557 Only accepts [place] Access Medicaid patients after they have their name applied to their card.  ° °Self-Pay (no insurance) in [place]: ° °Organization         Address  Phone   Notes  °Sickle Cell Patients, [facility] [address], [place] (336) 832-1970   °[facility] [address], [place] (336) 832-4400   °[facility] ° [address],  (336) 992-4800   °[facility]/Dr. Osei-Bonsu ° [address] [place] [address], [place] or [address], [place] (336) 841-8500 Phone number for both [place] and [place] locations is the same.  °[facility] [address], [place] (336) 299-0000   °[facility] [address] [place] [address], [place] or [address] (336) 852-7530 °(336) 878-2260   °[facility] [address], [place] (336) 350-1642, phone; (336) 294-5005, fax Sees patients 1st and 3rd Saturday of every month.  Must not qualify for public or private insurance (i.e. Medicaid, Medicare, [place] Health Choice, Veterans' Benefits) • Household income should be no more than 200% of the poverty level •The clinic cannot treat you if you are pregnant or think you are pregnant • Sexually transmitted diseases are not treated at the clinic.  ° ° °Dental Care: °Organization         Address  Phone  Notes  °[place] [facility] [address],  (336) 641-6152 Accepts children up to age 21 who are enrolled in Medicaid or [place] Health Choice; pregnant women with a Medicaid card; and children who have applied for Medicaid or [place] Health Choice, but were declined, whose parents can pay a reduced fee at time of service.  °[place]  [facility] [place]  [address], [place] (336) 641-7733 Accepts children up to age 21 who are enrolled in Medicaid or [place] Health Choice; pregnant women with a Medicaid card; and children who have applied for Medicaid or [place] Health Choice, but were declined, whose parents can pay a reduced fee at time of service.  °[facility] ° [address],  (336) 641-4533 Patients are seen by appointment only. Walk-ins are not accepted. [facility] will see patients 18 years of age and older. °Monday - Tuesday (8am-5pm) °Most Wednesdays (8:30-5pm) °$30 per visit, cash only  °[facility] ° [address], [place] (336) 641-4533 Patients are seen by appointment only. Walk-ins are not accepted. [facility] will see patients 18 years of age and older. °One   Wednesday Evening (Monthly: Volunteer Based).  $30 per visit, cash only  °[facility]  (919) 537-3737 for adults; Children under age 4, call Graduate Pediatric Dentistry at ([phone]) [phone]. Children aged 4-14, please call (919) [phone] to request a pediatric application. ° Dental services are provided in all areas of dental care including fillings, crowns and bridges, complete and partial dentures, implants, gum treatment, root canals, and extractions. Preventive care is also provided. Treatment is provided to both adults and children. °Patients are selected via a lottery and there is often a waiting list. °  °[facility] [address], °[place] ° (336) 763-8833 [email or web address] °  °[facility] [address], [place], [place] (336)723-1848, Ext. 123 Second and Fourth Thursday of each month, opens at 6:30 AM; Clinic ends at 9 AM.  Patients are seen on a first-come first-served basis, and a limited number are seen during each clinic.  ° °[facility] ° [address], [place], [place] (336) 723-7904    Eligibility Requirements °You must have lived in [place], [place], or [place] counties for at least the last three months. °  You cannot be eligible for state or federal sponsored healthcare insurance, including Veterans Administration, Medicaid, or Medicare. °  You generally cannot be eligible for healthcare insurance through your employer.  °  How to apply: °Eligibility screenings are held every Tuesday and Wednesday afternoon from 1:00 pm until 4:00 pm. You do not need an appointment for the interview!  °[facility] [address], [place], [place] 336-631-2330   °[facility]  336-342-8273   °[facility]  336-703-3100   °[facility]  336-570-6415   ° °Behavioral Health Resources in the Community: °Intensive Outpatient Programs °Organization         Address  Phone  Notes  °[facility] [address], [place], [place] 336-878-6098   °[facility] [address], [place], [place] 336-832-9800   °[facility] [address], [place], [place] ° 336-882-2125   °[facility] [address],  °[place], [place] 1-800-853-5163 or 336-641-4981   °Substance Abuse Resources °Organization         Address  Phone  Notes  °[facility]  336-882-2125   °[facility]  336-784-9470   °[facility]  336-285-9073   °[facility]  336-845-3988   °Residential & Outpatient Substance Abuse Program  1-800-659-3381   °Psychological Services °Organization         Address  Phone  Notes  °[facility]  336- 832-9600   °[facility]  336- 378-7881   °[facility] [address], [place] 1-800-853-5163 or 336-641-4981   ° °Mobile Crisis Teams °Organization         Address  Phone  Notes  °[facility], Mobile Crisis Care Unit  1-877-626-1772   °Assertive °[facility] ° [address]  [place], [place] 336-834-9664   °Sharon DeEsch [address] °[place] 336-554-5454   ° °Self-Help/Support Groups °Organization         Address  Phone             Notes  °[facility] [place] - variety of support groups  336- 373-1402 Call for more information  °Narcotics Anonymous (NA), [facility] [address], °[place]  2 meetings at this location  ° °  Residential Treatment Programs °Organization         Address  Phone  Notes  °[facility] [address],    °[place]  1-866-801-8205   °[facility] ° [address], [place], [place] 704-293-8524   °[facility] [address], [place] 336-845-3988 Admissions: 8am-3pm M-F  °[facility] [address]    °[place], [place] 336-841-1104   °[facility] [address], [place], [place] 336-379-7146   °[facility] [address]  °[place], [place] 336-285-9073   °[facility] Intensive Outpatient [address], [place], [place] 336-852-3033   °[facility] [address]  °[place], [place] 1-877-615-2722 or 336-784-9470   °[facility] [address] [place], [place] 336-227-7417 Accepts Medicaid  °[facility] [address]  °[place] 1-800-659-3381 Substance Abuse/Addiction Treatment  ° °[place] Behavioral Health Resources °Organization         Address  Phone  Notes  °[facility]  (888) 581-9988   °Julie Brannon, PhD [address] [place], [place]   (336) 349-5553 or (336) 951-0000   °[facility]   [address] °[place], [place] (336) 349-4454   °[facility] [address], [place], [place] (336) 342-8316 Insurance/Medicaid/sponsorship through [facility]  °[facility] [address]                                    [place], [place] (336) 342-8316 Therapy/tele-psych/case    °[facility] [address]  ° [place], [place] (336) 349-2233    °Dr. Arfeen  (336) 349-4544   °[facility] [place]  [employer] [place] [facility] 1) [address],  °2) [address], [place] °3)  [address], [place] (336) 349-3220 °(336) 342-7768 ° °(336) 342-8140   °[place] [facility] (336) 342-1394 or (336) 342-3537 (After Hours)    ° ° °Take the prescriptions as directed.  Apply moist heat or ice to the area(s) of discomfort, for 15 minutes at a time, several times per day for the next few days.  Do not fall asleep on a heating or ice pack.  Call your regular medical doctor on Monday to schedule a follow up appointment in the next 2 days.  Return to the Emergency Department immediately if worsening. ° °

## 2013-04-08 NOTE — ED Provider Notes (Signed)
CSN: [id number]     Arrival date & time 04/08/13  1346 History   First MD Initiated Contact with Patient 04/08/13 1420     Chief Complaint  Patient presents with  . Chest Pain  . Back Pain      HPI Pt was seen at 1420. Per pt, c/o gradual onset and persistence of constant right sided ribs "pain" for the past 2 to 3 months. Pt describes the pain as "sharp" and "like a knife." States the pain worsens with cough, deep breath, movement of his torso and palpation of the area. States the pain began "after I was coughing a lot" several months ago. States his cough has improved, but the right ribs pain continues. Denies palpitations, no SOB, no abd pain, no N/V/D, no fevers, no rash.     Past Medical History  Diagnosis Date  . Hypertension   . Gastric ulcer   . Kidney stones   . Diverticulosis   . DDD (degenerative disc disease), lumbar    Past Surgical History  Procedure Laterality Date  . Hernia repair      History  Substance Use Topics  . Smoking status: Current Every Day Smoker -- 1.00 packs/day    Types: Cigarettes  . Smokeless tobacco: Not on file  . Alcohol Use: 0.0 oz/week    Review of Systems ROS: Statement: All systems negative except as marked or noted in the HPI; Constitutional: Negative for fever and chills. ; ; Eyes: Negative for eye pain, redness and discharge. ; ; ENMT: Negative for ear pain, hoarseness, nasal congestion, sinus pressure and sore throat. ; ; Cardiovascular: Negative for chest pain, palpitations, diaphoresis, dyspnea and peripheral edema. ; ; Respiratory: +cough improved. Negative for wheezing and stridor. ; ; Gastrointestinal: Negative for nausea, vomiting, diarrhea, abdominal pain, blood in stool, hematemesis, jaundice and rectal bleeding. . ; ; Genitourinary: Negative for dysuria, flank pain and hematuria. ; ; Musculoskeletal: +right ribs pain. Negative for back pain and neck pain. Negative for swelling and trauma.; ; Skin: Negative for pruritus, rash,  abrasions, blisters, bruising and skin lesion.; ; Neuro: Negative for headache, lightheadedness and neck stiffness. Negative for weakness, altered level of consciousness , altered mental status, extremity weakness, paresthesias, involuntary movement, seizure and syncope.        Allergies  Review of patient's allergies indicates no known allergies.  Home Medications   Current Outpatient Rx  Name  Route  Sig  Dispense  Refill  . aspirin EC 81 MG tablet   Oral   Take 81 mg by mouth daily.         Marland Kitchen atenolol (TENORMIN) 50 MG tablet   Oral   Take 50 mg by mouth 2 (two) times daily.         . furosemide (LASIX) 40 MG tablet   Oral   Take 1 tablet (40 mg total) by mouth daily.   30 tablet   0   . lisinopril (PRINIVIL,ZESTRIL) 2.5 MG tablet   Oral   Take 2.5 mg by mouth daily.         . Multiple Vitamins-Minerals (MULTIVITAMINS THER. W/MINERALS) TABS tablet   Oral   Take 1 tablet by mouth daily.         . Potassium 99 MG TABS   Oral   Take 4 tablets by mouth daily.          BP 177/111  Pulse 73  Temp(Src) 97.8 F (36.6 C) (Oral)  Resp 20  SpO2 100% Physical  Exam 1430: Physical examination:  Nursing notes reviewed; Vital signs and O2 SAT reviewed;  Constitutional: Well developed, Well nourished, Well hydrated, In no acute distress; Head:  Normocephalic, atraumatic; Eyes: EOMI, PERRL, No scleral icterus; ENMT: TM's clear bilat +edemetous nasal turbinates bilat with clear rhinorrhea. Mouth and pharynx normal, Mucous membranes moist; Neck: Supple, Full range of motion, No lymphadenopathy; Cardiovascular: Regular rate and rhythm, No murmur, rub, or gallop; Respiratory: Breath sounds clear & equal bilaterally, No rales, rhonchi, wheezes.  Speaking full sentences with ease, Normal respiratory effort/excursion; Chest: +right mid-lateral and anterior chest wall areas tender to palp without rash, soft tissue crepitus, ecchymosis or deformity. Movement normal; Abdomen: Soft,  Nontender, Nondistended, Normal bowel sounds; Genitourinary: No CVA tenderness; Extremities: Pulses normal, No tenderness, No edema, No calf edema or asymmetry.; Neuro: AA&Ox3, Major CN grossly intact.  Speech clear. No gross focal motor or sensory deficits in extremities. Climbs on and off stretcher easily by himself. Gait steady.; Skin: Color normal, Warm, Dry.   ED Course  Procedures     EKG Interpretation None      MDM  MDM Reviewed: previous chart, nursing note and vitals Reviewed previous: labs Interpretation: labs and CT scan    Results for orders placed during the hospital encounter of 04/08/13  I-STAT CHEM 8, ED      Result Value Ref Range   Sodium 142  137 - 147 mEq/L   Potassium 4.0  3.7 - 5.3 mEq/L   Chloride 105  96 - 112 mEq/L   BUN 9  6 - 23 mg/dL   Creatinine, Ser 1.610.80  0.50 - 1.35 mg/dL   Glucose, Bld 096139 (*) 70 - 99 mg/dL   Calcium, Ion 0.451.24 (*) 1.12 - 1.23 mmol/L   TCO2 25  0 - 100 mmol/L   Hemoglobin 16.0  13.0 - 17.0 g/dL   HCT 40.947.0  81.139.0 - 91.452.0 %   Ct Angio Chest Pe W/cm &/or Wo Cm 04/08/2013   CLINICAL DATA:  Chest pain  EXAM: CT ANGIOGRAPHY CHEST WITH CONTRAST  TECHNIQUE: Multidetector CT imaging of the chest was performed using the standard protocol during bolus administration of intravenous contrast. Multiplanar CT image reconstructions and MIPs were obtained to evaluate the vascular anatomy.  CONTRAST:  100mL OMNIPAQUE IOHEXOL 350 MG/ML SOLN  COMPARISON:  None.  FINDINGS: There is no demonstrable pulmonary embolus. There is no thoracic aortic aneurysm or dissection.  The lungs are clear.  There is no appreciable thoracic adenopathy. The pericardium is not appreciably thickened.  In the visualized upper abdomen, there is fatty change in the liver. Visualized upper abdomen otherwise appears normal. There are no blastic or lytic bone lesions.  Review of the MIP images confirms the above findings.  IMPRESSION: No demonstrable pulmonary embolus. Lungs clear.  Fatty liver present.   Electronically Signed   By: Bretta BangWilliam  Woodruff M.D.   On: 04/08/2013 15:34    1540:  Workup reassuring. Will tx symptomatically at this time. Dx and testing d/w pt.  Questions answered.  Verb understanding, agreeable to d/c home with outpt f/u.   Laray AngerKathleen M Geralene Afshar, DO 04/10/13 1409

## 2013-04-08 NOTE — ED Notes (Signed)
Pt c/o chest pain that radiates to mid-back x2-3 months. Pt also reports productive cough with yellow mucus.

## 2013-04-12 ENCOUNTER — Emergency Department (HOSPITAL_COMMUNITY): Payer: Medicaid Other

## 2013-04-12 ENCOUNTER — Encounter (HOSPITAL_COMMUNITY): Payer: Self-pay | Admitting: Emergency Medicine

## 2013-04-12 ENCOUNTER — Emergency Department (HOSPITAL_COMMUNITY)
Admission: EM | Admit: 2013-04-12 | Discharge: 2013-04-12 | Disposition: A | Discharge status: Home / Self Care | Payer: Medicaid Other | Attending: Emergency Medicine | Admitting: Emergency Medicine

## 2013-04-12 DIAGNOSIS — Z79899 Other long term (current) drug therapy: Secondary | ICD-10-CM | POA: Insufficient documentation

## 2013-04-12 DIAGNOSIS — Z8719 Personal history of other diseases of the digestive system: Secondary | ICD-10-CM | POA: Insufficient documentation

## 2013-04-12 DIAGNOSIS — R059 Cough, unspecified: Secondary | ICD-10-CM | POA: Insufficient documentation

## 2013-04-12 DIAGNOSIS — R0789 Other chest pain: Secondary | ICD-10-CM | POA: Insufficient documentation

## 2013-04-12 DIAGNOSIS — F17200 Nicotine dependence, unspecified, uncomplicated: Secondary | ICD-10-CM | POA: Insufficient documentation

## 2013-04-12 DIAGNOSIS — R0781 Pleurodynia: Secondary | ICD-10-CM

## 2013-04-12 DIAGNOSIS — I10 Essential (primary) hypertension: Secondary | ICD-10-CM | POA: Insufficient documentation

## 2013-04-12 DIAGNOSIS — R05 Cough: Secondary | ICD-10-CM | POA: Insufficient documentation

## 2013-04-12 DIAGNOSIS — Z87442 Personal history of urinary calculi: Secondary | ICD-10-CM | POA: Insufficient documentation

## 2013-04-12 DIAGNOSIS — Z8739 Personal history of other diseases of the musculoskeletal system and connective tissue: Secondary | ICD-10-CM | POA: Insufficient documentation

## 2013-04-12 DIAGNOSIS — Z7982 Long term (current) use of aspirin: Secondary | ICD-10-CM | POA: Insufficient documentation

## 2013-04-12 DIAGNOSIS — Z791 Long term (current) use of non-steroidal anti-inflammatories (NSAID): Secondary | ICD-10-CM | POA: Insufficient documentation

## 2013-04-12 IMAGING — CR DG CHEST 2V
2 series · 2 of 2 positions shown · non-contrast
Comparison: CT ANGIO CHEST W/CM &/OR WO/CM dated [DATE]

CLINICAL DATA: Chronic cough now with shortness of breath and upper
back pain for the preceding 24 hr

EXAM:
CHEST  2 VIEW

[view not recorded (1 of 2)]
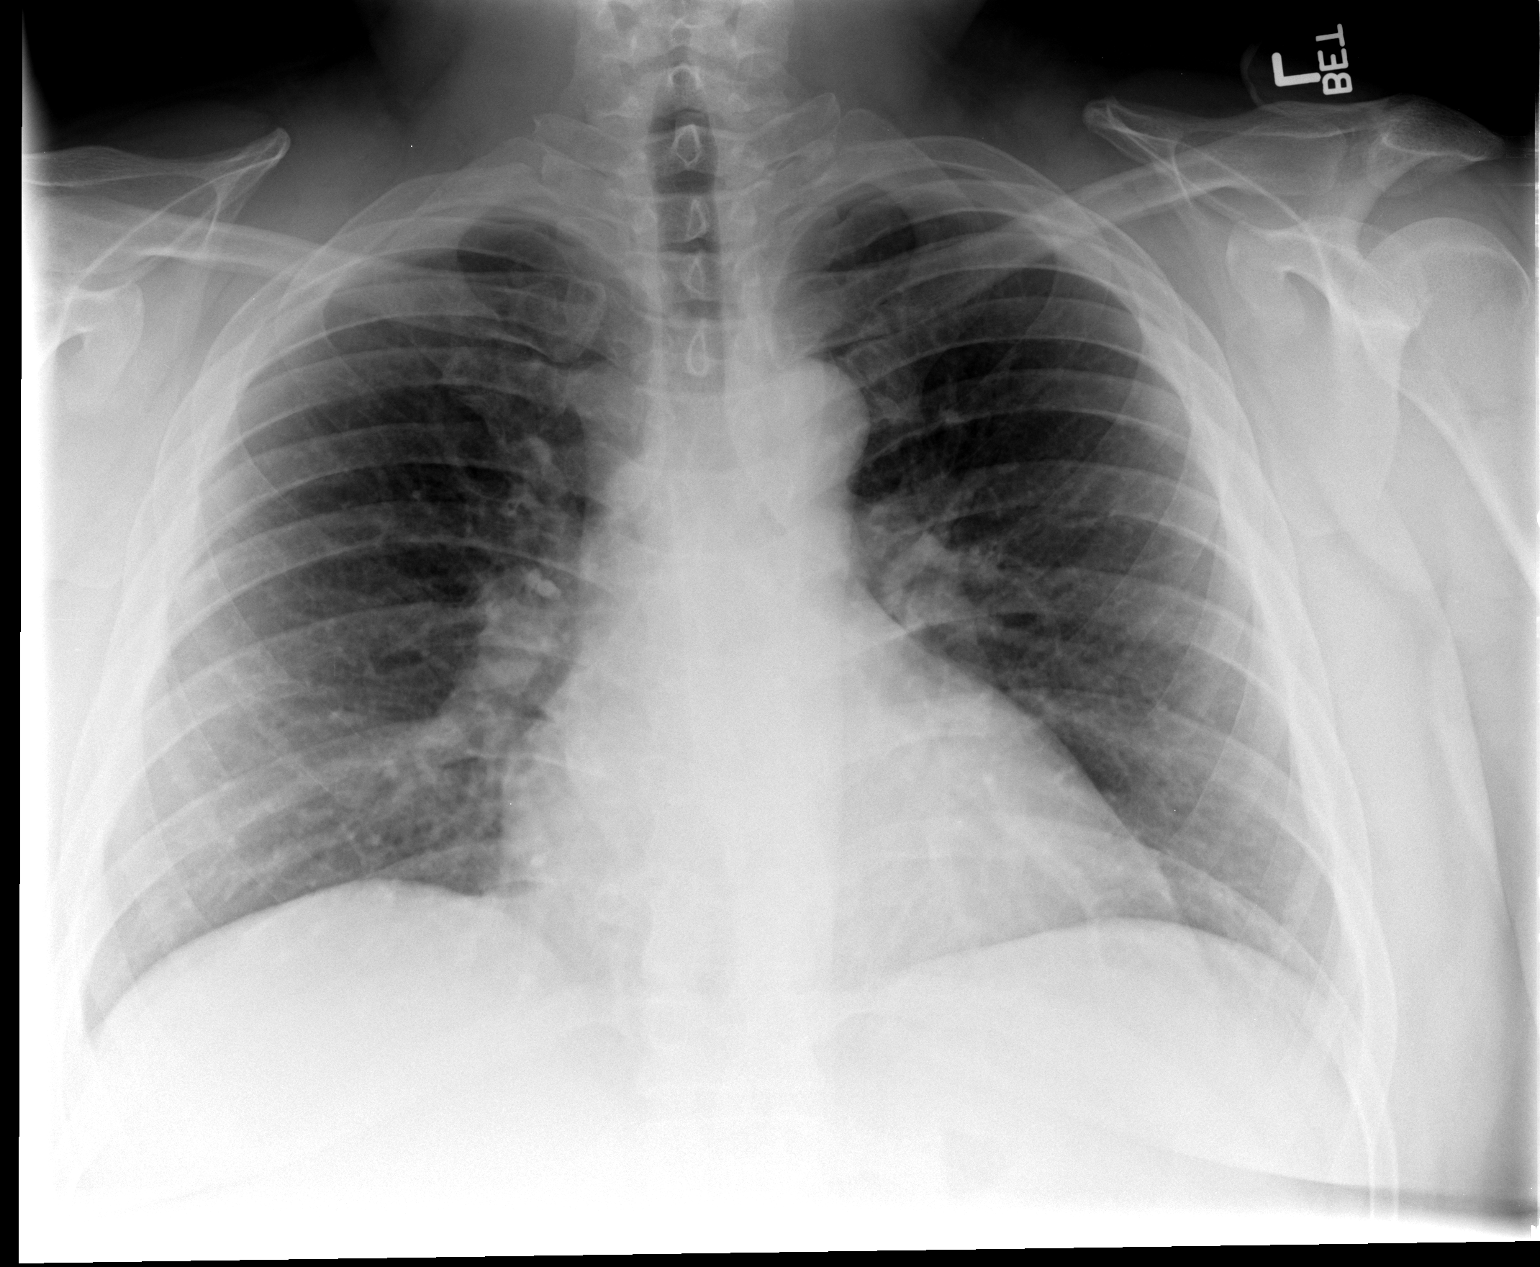

[view not recorded (2 of 2)]
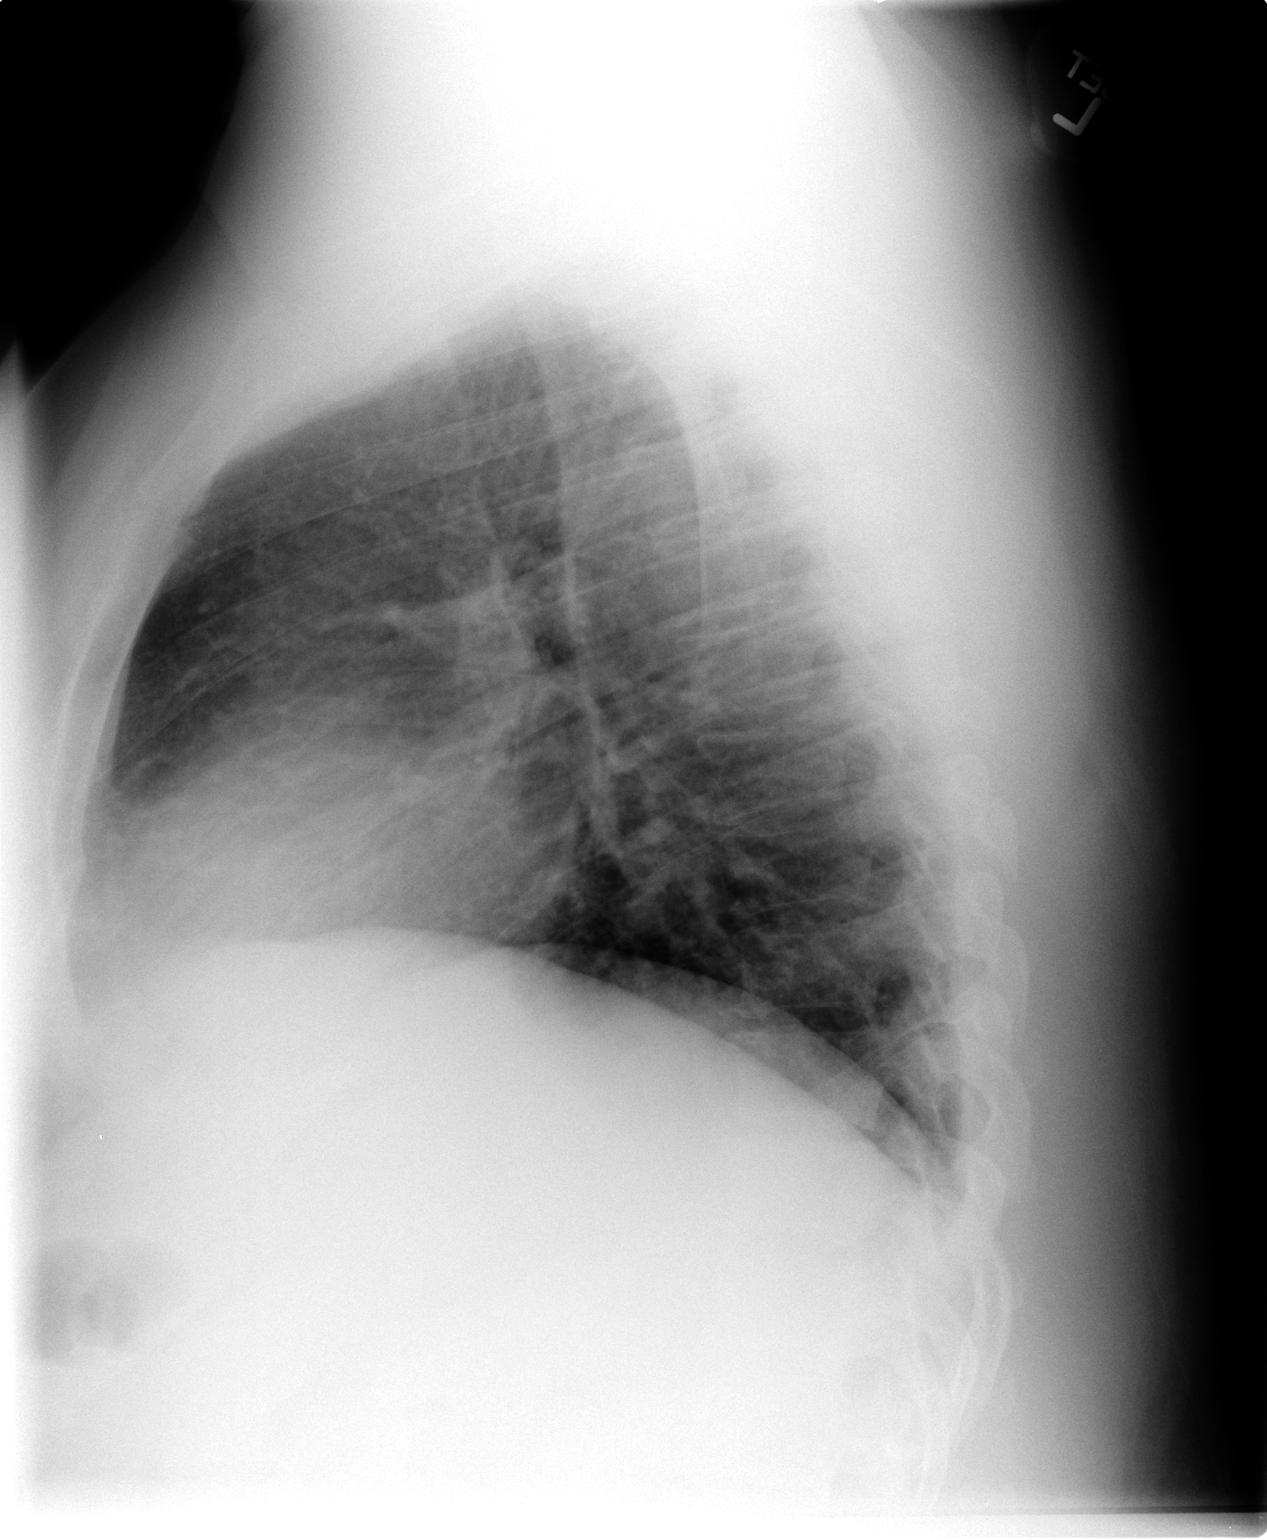

[2 of 2 positions shown; findings below may reference images not displayed]

FINDINGS: The lungs are adequately inflated. There is no focal infiltrate. The
cardiac silhouette is top-normal in size. The pulmonary vascularity
is not engorged. The mediastinum is normal in width. There is no
pleural effusion or pneumothorax or pneumomediastinum. The trachea
is midline. The observed portions of the bony thorax exhibit no
acute abnormalities.
IMPRESSION: There is no evidence of active cardiopulmonary disease.

## 2013-04-12 IMAGING — CR DG RIBS 2V*R*
3 series · 3 of 3 positions shown · non-contrast
Comparison: Chest CT [DATE].

CLINICAL DATA: Shortness of breath and right rib pain.

EXAM:
RIGHT RIBS - 2 VIEW

[view not recorded (1 of 3)]
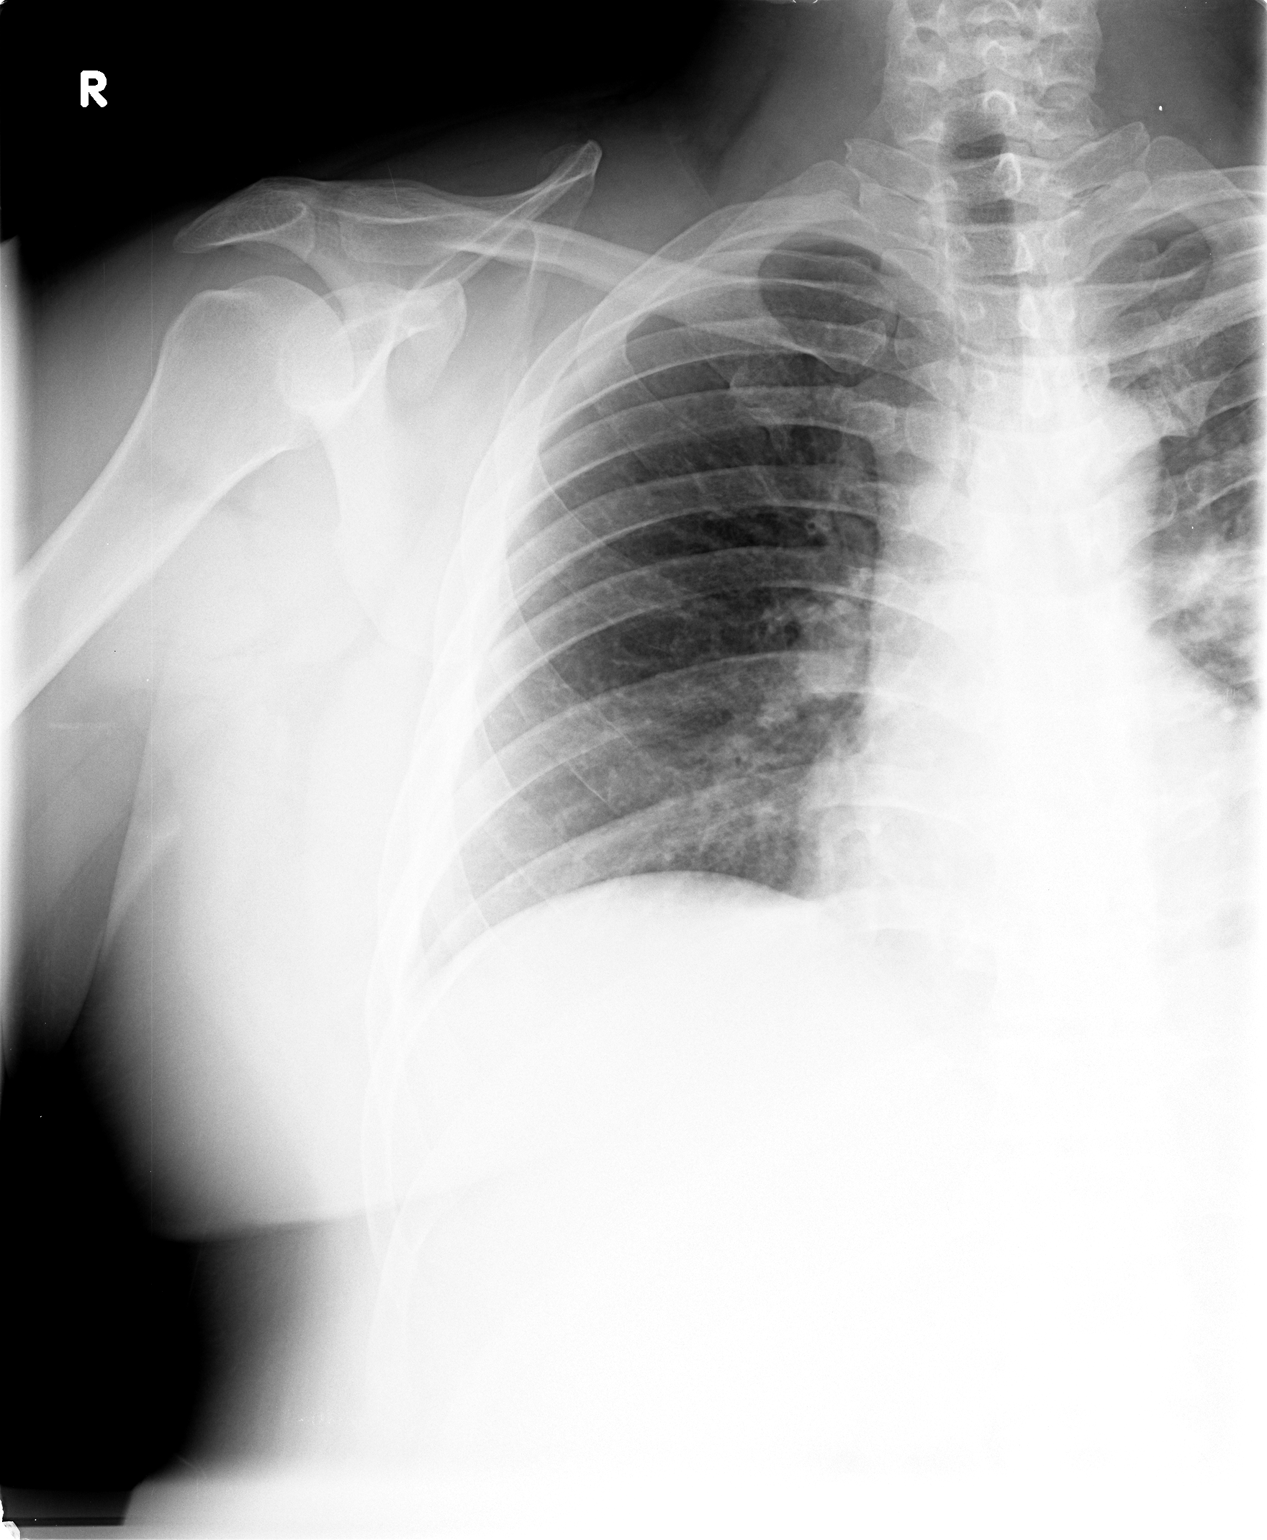

[view not recorded (2 of 3)]
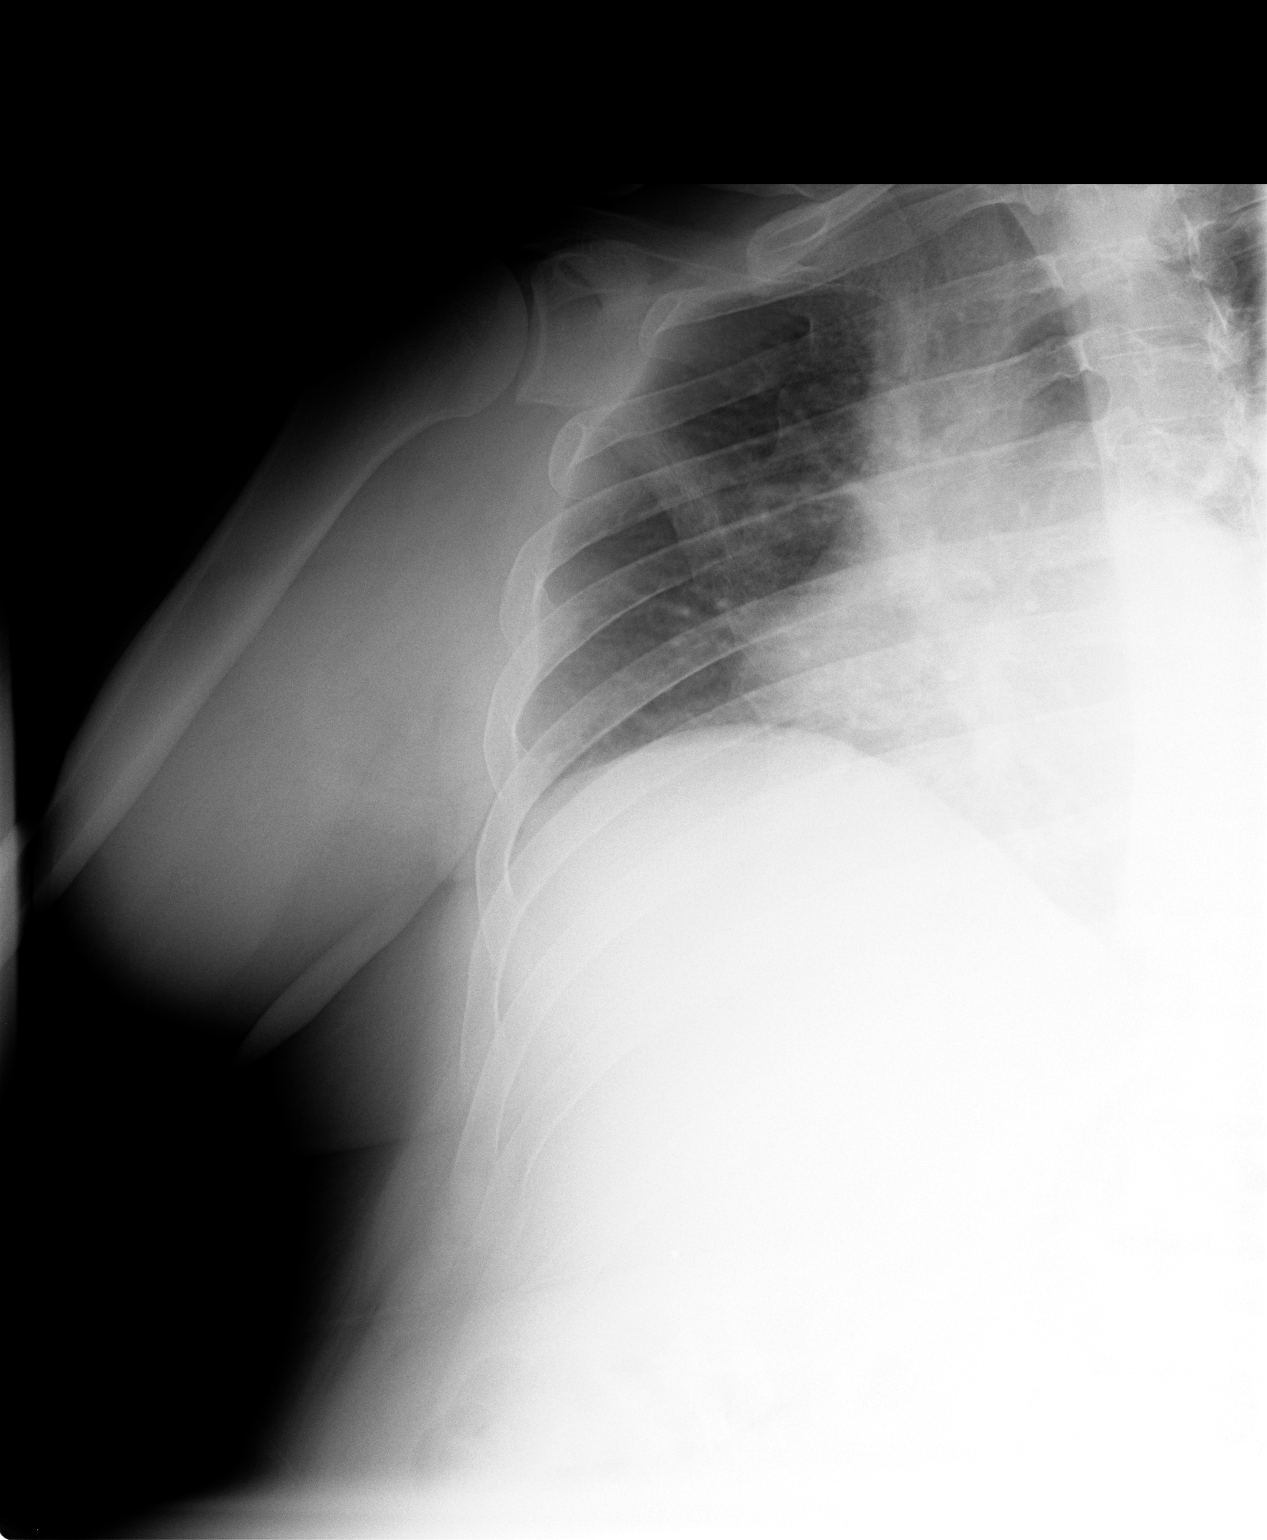

[view not recorded (3 of 3)]
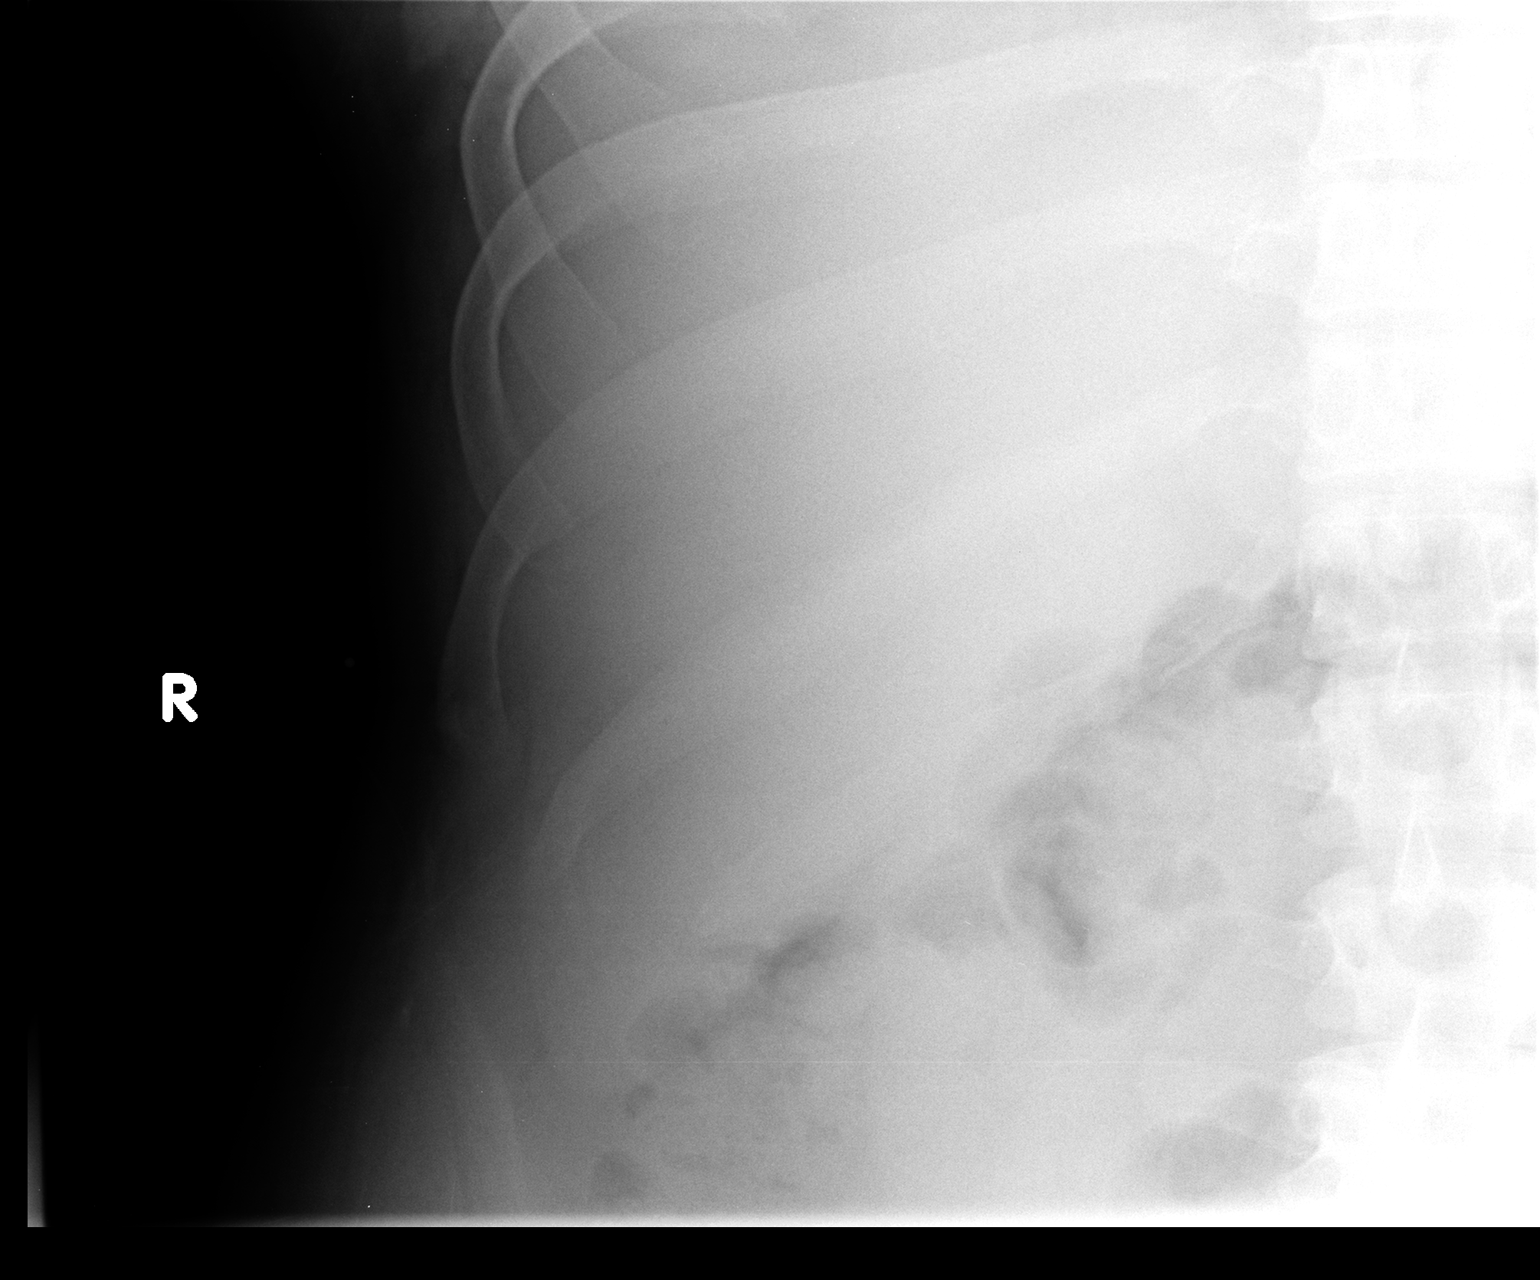

[3 of 3 positions shown; findings below may reference images not displayed]

FINDINGS: The right ribs are intact. No acute fracture or rib lesion. The
right lung is clear.
IMPRESSION: No acute bony findings.

## 2013-04-12 MED ORDER — HYDROMORPHONE HCL PF 1 MG/ML IJ SOLN
1.0000 mg | Freq: Once | INTRAMUSCULAR | Status: AC
  Administered 2013-04-12: 1 mg via INTRAMUSCULAR
  Filled 2013-04-12: qty 1

## 2013-04-12 MED ORDER — OXYCODONE-ACETAMINOPHEN 5-325 MG PO TABS: 1.0000 | ORAL_TABLET | Freq: Four times a day (QID) | ORAL | Status: DC | PRN

## 2013-04-12 NOTE — ED Provider Notes (Signed)
CSN: [id number]     Arrival date & time 04/12/13  1636 History  This chart was scribed for Benny LennertJoseph L Indiana Gamero, MD by Marica OtterNusrat Rahman, ED Scribe. This patient was seen in room APA09/APA09 and the patient's care was started at 6:10 PM.  PCP: No PCP Per Patient  Chief Complaint  Patient presents with  . Shortness of Breath   Patient is a 38 y.o. male presenting with shortness of breath. The history is provided by the patient. No language interpreter was used.  Shortness of Breath Severity:  Moderate Onset quality:  Sudden Timing:  Constant Progression:  Unchanged Associated symptoms: cough   Associated symptoms: no abdominal pain, no chest pain, no headaches and no rash    HPI Comments: Dylan Ross is a 38 y.o. male, with a history of HTN, DDD and diverticulosis, who presents to the Emergency Department complaining of intermittent cough with associated constant, right sided rib pain. Pt reports that his cough began 2-3 months ago and the right sided rib pain began last night while coughing. Pt further reports that he heard a loud snap in his back when he was having an episode of coughing last night; and soon thereafter, his rib pain began.  Pt was treated at the ED 4 days ago for right sided rib pain that began 2-3 months ago after pt "was coughing a lot."   Past Medical History  Diagnosis Date  . Hypertension   . Gastric ulcer   . Kidney stones   . Diverticulosis   . DDD (degenerative disc disease), lumbar    Past Surgical History  Procedure Laterality Date  . Hernia repair     No family history on file. History  Substance Use Topics  . Smoking status: Current Every Day Smoker -- 1.00 packs/day    Types: Cigarettes  . Smokeless tobacco: Not on file  . Alcohol Use: 0.0 oz/week    Review of Systems  Constitutional: Negative for appetite change and fatigue.  HENT: Negative for congestion, ear discharge and sinus pressure.   Eyes: Negative for discharge.  Respiratory: Positive for  cough and shortness of breath.   Cardiovascular: Negative for chest pain.  Gastrointestinal: Negative for abdominal pain and diarrhea.  Genitourinary: Negative for frequency and hematuria.  Musculoskeletal: Negative for back pain.       Right sided rib pain  Skin: Negative for rash.  Neurological: Negative for seizures and headaches.  Psychiatric/Behavioral: Negative for hallucinations.      Allergies  Review of patient's allergies indicates no known allergies.  Home Medications   Current Outpatient Rx  Name  Route  Sig  Dispense  Refill  . aspirin EC 81 MG tablet   Oral   Take 81 mg by mouth daily.         Marland Kitchen. atenolol (TENORMIN) 50 MG tablet   Oral   Take 50 mg by mouth 2 (two) times daily.         . furosemide (LASIX) 40 MG tablet   Oral   Take 1 tablet (40 mg total) by mouth daily.   30 tablet   0   . lisinopril (PRINIVIL,ZESTRIL) 2.5 MG tablet   Oral   Take 2.5 mg by mouth daily.         . methocarbamol (ROBAXIN) 500 MG tablet   Oral   Take 2 tablets (1,000 mg total) by mouth 4 (four) times daily as needed for muscle spasms (muscle spasm/pain).   25 tablet   0   .  Multiple Vitamins-Minerals (MULTIVITAMINS THER. W/MINERALS) TABS tablet   Oral   Take 1 tablet by mouth daily.         . naproxen (NAPROSYN) 250 MG tablet   Oral   Take 1 tablet (250 mg total) by mouth 2 (two) times daily with a meal.   14 tablet   0   . Potassium 99 MG TABS   Oral   Take 4 tablets by mouth daily.          Triage Vitals: BP 171/99  Pulse 107  Temp(Src) 98.2 F (36.8 C) (Oral)  Resp 20  Ht 5\' 11"  (1.803 m)  Wt 320 lb (145.151 kg)  BMI 44.65 kg/m2  SpO2 98% Physical Exam  Constitutional: He is oriented to person, place, and time. He appears well-developed.  HENT:  Head: Normocephalic.  Eyes: Conjunctivae and EOM are normal. No scleral icterus.  Neck: Neck supple. No thyromegaly present.  Cardiovascular: Normal rate and regular rhythm.  Exam reveals no  gallop and no friction rub.   No murmur heard. Pulmonary/Chest: No stridor. He has no wheezes. He has no rales. He exhibits no tenderness.  Abdominal: He exhibits no distension. There is no tenderness. There is no rebound.  Musculoskeletal: Normal range of motion. He exhibits tenderness (Moderate tenderness to right lower ribs). He exhibits no edema.  Lymphadenopathy:    He has no cervical adenopathy.  Neurological: He is oriented to person, place, and time. He exhibits normal muscle tone. Coordination normal.  Skin: No rash noted. No erythema.  Psychiatric: He has a normal mood and affect. His behavior is normal.    ED Course  Procedures (including critical care time) DIAGNOSTIC STUDIES: Oxygen Saturation is 98% on RA, adequate by my interpretation.    COORDINATION OF CARE:  6:13 PM-Discussed treatment plan which includes CXR and meds with pt at bedside and pt agreed to plan.   Labs Review Labs Reviewed - No data to display Imaging Review Dg Chest 2 View  04/12/2013   CLINICAL DATA:  Chronic cough now with shortness of breath and upper back pain for the preceding 24 hr  EXAM: CHEST  2 VIEW  COMPARISON:  CT ANGIO CHEST W/CM &/OR WO/CM dated 04/08/2013  FINDINGS: The lungs are adequately inflated. There is no focal infiltrate. The cardiac silhouette is top-normal in size. The pulmonary vascularity is not engorged. The mediastinum is normal in width. There is no pleural effusion or pneumothorax or pneumomediastinum. The trachea is midline. The observed portions of the bony thorax exhibit no acute abnormalities.  IMPRESSION: There is no evidence of active cardiopulmonary disease.   Electronically Signed   By: David  [place]   On: 04/12/2013 17:02     EKG Interpretation None      MDM   Final diagnoses:  None    The chart was scribed for me under my direct supervision.  I personally performed the history, physical, and medical decision making and all procedures in the evaluation of this  patient.Benny Lennert, MD 04/12/13 2012

## 2013-04-12 NOTE — ED Notes (Signed)
Patient is resting comfortably. 

## 2013-04-12 NOTE — ED Notes (Signed)
Pt states he heard a loud snap to side/ back pain when coughing last night. States pain was so bad that he passed out. Pt states SOB. Able to speak complete sentences.

## 2013-04-12 NOTE — Discharge Instructions (Signed)
Follow up with your md next week for recheck °

## 2013-05-23 ENCOUNTER — Encounter: Payer: Self-pay | Admitting: *Deleted

## 2013-06-23 ENCOUNTER — Ambulatory Visit: Payer: Medicaid Other | Admitting: Gastroenterology

## 2013-06-23 ENCOUNTER — Telehealth: Payer: Self-pay | Admitting: Gastroenterology

## 2013-06-23 NOTE — Telephone Encounter (Signed)
Pt was a no show

## 2014-03-28 ENCOUNTER — Encounter (INDEPENDENT_AMBULATORY_CARE_PROVIDER_SITE_OTHER): Payer: Self-pay | Admitting: *Deleted

## 2014-04-19 ENCOUNTER — Ambulatory Visit (INDEPENDENT_AMBULATORY_CARE_PROVIDER_SITE_OTHER): Payer: Medicaid Other | Admitting: Internal Medicine

## 2014-06-06 ENCOUNTER — Encounter (INDEPENDENT_AMBULATORY_CARE_PROVIDER_SITE_OTHER): Payer: Self-pay | Admitting: *Deleted

## 2015-01-09 ENCOUNTER — Encounter (INDEPENDENT_AMBULATORY_CARE_PROVIDER_SITE_OTHER): Payer: Self-pay | Admitting: *Deleted

## 2015-02-04 ENCOUNTER — Encounter (INDEPENDENT_AMBULATORY_CARE_PROVIDER_SITE_OTHER): Payer: Self-pay | Admitting: Internal Medicine

## 2015-02-04 ENCOUNTER — Ambulatory Visit (INDEPENDENT_AMBULATORY_CARE_PROVIDER_SITE_OTHER): Payer: Medicaid Other | Admitting: Internal Medicine

## 2017-02-16 ENCOUNTER — Emergency Department (HOSPITAL_COMMUNITY)
Admission: EM | Admit: 2017-02-16 | Discharge: 2017-02-16 | Disposition: A | Discharge status: Home / Self Care | Payer: Self-pay | Attending: Emergency Medicine | Admitting: Emergency Medicine

## 2017-02-16 ENCOUNTER — Other Ambulatory Visit: Payer: Self-pay

## 2017-02-16 ENCOUNTER — Encounter (HOSPITAL_COMMUNITY): Payer: Self-pay | Admitting: Emergency Medicine

## 2017-02-16 DIAGNOSIS — F17210 Nicotine dependence, cigarettes, uncomplicated: Secondary | ICD-10-CM | POA: Insufficient documentation

## 2017-02-16 DIAGNOSIS — Z7984 Long term (current) use of oral hypoglycemic drugs: Secondary | ICD-10-CM | POA: Insufficient documentation

## 2017-02-16 DIAGNOSIS — E118 Type 2 diabetes mellitus with unspecified complications: Secondary | ICD-10-CM

## 2017-02-16 DIAGNOSIS — E1165 Type 2 diabetes mellitus with hyperglycemia: Secondary | ICD-10-CM | POA: Insufficient documentation

## 2017-02-16 DIAGNOSIS — R739 Hyperglycemia, unspecified: Secondary | ICD-10-CM

## 2017-02-16 DIAGNOSIS — Z79899 Other long term (current) drug therapy: Secondary | ICD-10-CM | POA: Insufficient documentation

## 2017-02-16 DIAGNOSIS — I10 Essential (primary) hypertension: Secondary | ICD-10-CM | POA: Insufficient documentation

## 2017-02-16 HISTORY — DX: Type 2 diabetes mellitus without complications: E11.9

## 2017-02-16 LAB — CBG MONITORING, ED
Glucose-Capillary: 492 mg/dL — ABNORMAL HIGH (ref 65–99)
Glucose-Capillary: 379 mg/dL — ABNORMAL HIGH (ref 65–99)

## 2017-02-16 LAB — CBC
HCT: 48.3 % (ref 39.0–52.0)
Hemoglobin: 16.9 g/dL (ref 13.0–17.0)
MCH: 31.8 pg (ref 26.0–34.0)
MCHC: 35 g/dL (ref 30.0–36.0)
MCV: 91 fL (ref 78.0–100.0)
Platelets: 231 10*3/uL (ref 150–400)
RBC: 5.31 MIL/uL (ref 4.22–5.81)
RDW: 13.1 % (ref 11.5–15.5)
WBC: 8.5 10*3/uL (ref 4.0–10.5)

## 2017-02-16 LAB — URINALYSIS, ROUTINE W REFLEX MICROSCOPIC
BILIRUBIN URINE: NEGATIVE
Bacteria, UA: NONE SEEN
Glucose, UA: >=500 mg/dL — AB
HGB URINE DIPSTICK: NEGATIVE
Ketones, ur: NEGATIVE mg/dL
Leukocytes, UA: NEGATIVE
NITRITE: NEGATIVE
Protein, ur: NEGATIVE mg/dL
SPECIFIC GRAVITY, URINE: 1.031 — AB (ref 1.005–1.030)
pH: 5 (ref 5.0–8.0)

## 2017-02-16 LAB — RAPID URINE DRUG SCREEN, HOSP PERFORMED
AMPHETAMINES: NOT DETECTED
Barbiturates: NOT DETECTED
Benzodiazepines: POSITIVE — AB
Cocaine: NOT DETECTED
Opiates: NOT DETECTED
Tetrahydrocannabinol: POSITIVE — AB

## 2017-02-16 LAB — COMPREHENSIVE METABOLIC PANEL
ALK PHOS: 184 U/L — AB (ref 38–126)
ALT: 61 U/L (ref 17–63)
ANION GAP: 13 (ref 5–15)
AST: 33 U/L (ref 15–41)
Albumin: 3.9 g/dL (ref 3.5–5.0)
BILIRUBIN TOTAL: 0.4 mg/dL (ref 0.3–1.2)
BUN: 19 mg/dL (ref 6–20)
CALCIUM: 10.3 mg/dL (ref 8.9–10.3)
CO2: 24 mmol/L (ref 22–32)
Chloride: 98 mmol/L — ABNORMAL LOW (ref 101–111)
Creatinine, Ser: 0.83 mg/dL (ref 0.61–1.24)
GFR calc Af Amer: >60 mL/min (ref >60)
GFR calc non Af Amer: >60 mL/min (ref >60)
Glucose, Bld: 509 mg/dL (ref 65–99)
Potassium: 4.1 mmol/L (ref 3.5–5.1)
Sodium: 135 mmol/L (ref 135–145)
TOTAL PROTEIN: 7.8 g/dL (ref 6.5–8.1)

## 2017-02-16 LAB — LIPASE, BLOOD: Lipase: 44 U/L (ref 11–51)

## 2017-02-16 MED ORDER — METFORMIN HCL 1000 MG PO TABS: 1000.0000 mg | ORAL_TABLET | Freq: Two times a day (BID) | ORAL | 3 refills | Status: DC

## 2017-02-16 MED ORDER — SODIUM CHLORIDE 0.9 % IV BOLUS (SEPSIS)
2000.0000 mL | Freq: Once | INTRAVENOUS | Status: AC
  Administered 2017-02-16: 2000 mL via INTRAVENOUS

## 2017-02-16 MED ORDER — METFORMIN HCL 500 MG PO TABS: 500.0000 mg | ORAL_TABLET | Freq: Two times a day (BID) | ORAL | 3 refills | Status: DC

## 2017-02-16 MED ORDER — METFORMIN HCL 500 MG PO TABS: 1000.0000 mg | ORAL_TABLET | Freq: Two times a day (BID) | ORAL | 3 refills | Status: DC

## 2017-02-16 NOTE — ED Notes (Addendum)
CBG 492 in triage.  

## 2017-02-16 NOTE — ED Notes (Signed)
Critical value of Glucose 509 given to Dr. Ranae PalmsYelverton.

## 2017-02-16 NOTE — ED Triage Notes (Signed)
Patient states he has had several episodes of urinary incontinence over the past few weeks.

## 2017-02-16 NOTE — ED Triage Notes (Signed)
Patient reports abdominal pain that started 2 weeks ago. States he has had a lot of trouble with reflux.

## 2017-02-16 NOTE — ED Provider Notes (Addendum)
[facility] EMERGENCY DEPARTMENT Provider Note   CSN: [id number] Arrival date & time: 02/16/17  1547     History   Chief Complaint Chief Complaint  Patient presents with  . Abdominal Pain  . Hyperglycemia    HPI Dylan Ross is a 42 y.o. male.  He is here for evaluation of urinary incontinence, high blood sugar, and epigastric abdominal discomfort.  Onset of symptoms, several days ago.  Evaluated for similar symptoms 2 days ago, at an outlying emergency department.  At that time he was told to increase his metformin from 1 500 mg tablet to 1 pill twice a day.  He has done that.  His blood pressure was also high at that time and he has since restarted on his daily dose of lisinopril.  He admits to not being compliant with a low carbohydrate diet.  He continues to smoke.  He works as a Museum/gallery exhibitions officer.  He denies cough, chest pain, focal weakness or paresthesia.  He has not had a fever, chills, nausea or vomiting.  There are no other known modifying factors. HPI  Past Medical History:  Diagnosis Date  . DDD (degenerative disc disease), lumbar   . Diabetes mellitus without complication (HCC)   . Diverticulosis   . Gastric ulcer   . Hypertension   . Kidney stones     There are no active problems to display for this patient.   Past Surgical History:  Procedure Laterality Date  . HERNIA REPAIR         Home Medications    Prior to Admission medications   Medication Sig Start Date End Date Taking? Authorizing Provider  atenolol (TENORMIN) 50 MG tablet Take 50 mg by mouth 2 (two) times daily.   Yes [provider]  furosemide (LASIX) 40 MG tablet Take 1 tablet (40 mg total) by mouth daily. Patient taking differently: Take 40 mg by mouth daily as needed for fluid or edema.  11/09/12  Yes Donnetta Hutching, MD  lisinopril (PRINIVIL,ZESTRIL) 20 MG tablet Take 20 mg by mouth daily. 02/12/17  Yes [provider]  metFORMIN (GLUCOPHAGE) 1000 MG tablet Take 1 tablet  (1,000 mg total) by mouth 2 (two) times daily. 02/16/17   Mancel Bale, MD    Family History Family History  Problem Relation Age of Onset  . Stroke Mother   . Cancer Mother     Social History Social History   Tobacco Use  . Smoking status: Current Every Day Smoker    Packs/day: 0.50    Types: Cigarettes  . Smokeless tobacco: Never Used  Substance Use Topics  . Alcohol use: Yes    Alcohol/week: 0.0 oz  . Drug use: Yes    Types: Marijuana    Comment: week ago     Allergies   Patient has no known allergies.   Review of Systems Review of Systems  All other systems reviewed and are negative.    Physical Exam Updated Vital Signs BP (!) 156/97 (BP Location: Left Arm)   Pulse 88   Temp 98 F (36.7 C) (Oral)   Resp 19   Ht 5\' 11"  (1.803 m)   Wt 127 kg (280 lb)   SpO2 94%   BMI 39.05 kg/m   Physical Exam  Constitutional: He is oriented to person, place, and time. He appears well-developed.  Obese  HENT:  Head: Normocephalic and atraumatic.  Right Ear: External ear normal.  Left Ear: External ear normal.  Eyes: Conjunctivae and EOM are normal.  Pupils are equal, round, and reactive to light.  Neck: Normal range of motion and phonation normal. Neck supple.  Cardiovascular: Normal rate, regular rhythm and normal heart sounds.  Pulmonary/Chest: Effort normal and breath sounds normal. He exhibits no bony tenderness.  Abdominal: Soft. There is no tenderness.  Musculoskeletal: Normal range of motion.  Neurological: He is alert and oriented to person, place, and time. No cranial nerve deficit or sensory deficit. He exhibits normal muscle tone. Coordination normal.  Skin: Skin is warm, dry and intact.  Psychiatric: He has a normal mood and affect. His behavior is normal. Judgment and thought content normal.  Nursing note and vitals reviewed.    ED Treatments / Results  Labs (all labs ordered are listed, but only abnormal results are displayed) Labs Reviewed    COMPREHENSIVE METABOLIC PANEL - Abnormal; Notable for the following components:      Result Value   Chloride 98 (*)    Glucose, Bld 509 (*)    Alkaline Phosphatase 184 (*)    All other components within normal limits  URINALYSIS, ROUTINE W REFLEX MICROSCOPIC - Abnormal; Notable for the following components:   Color, Urine STRAW (*)    Specific Gravity, Urine 1.031 (*)    Glucose, UA >=500 (*)    Squamous Epithelial / LPF 0-5 (*)    All other components within normal limits  RAPID URINE DRUG SCREEN, HOSP PERFORMED - Abnormal; Notable for the following components:   Benzodiazepines POSITIVE (*)    Tetrahydrocannabinol POSITIVE (*)    All other components within normal limits  CBG MONITORING, ED - Abnormal; Notable for the following components:   Glucose-Capillary 492 (*)    All other components within normal limits  CBG MONITORING, ED - Abnormal; Notable for the following components:   Glucose-Capillary 379 (*)    All other components within normal limits  LIPASE, BLOOD  CBC    EKG  EKG Interpretation None       Radiology No results found.  Procedures Procedures (including critical care time)  Medications Ordered in ED Medications  sodium chloride 0.9 % bolus 2,000 mL (2,000 mLs Intravenous New Bag/Given 02/16/17 1800)     Initial Impression / Assessment and Plan / ED Course  I have reviewed the triage vital signs and the nursing notes.  Pertinent labs & imaging results that were available during my care of the patient were reviewed by me and considered in my medical decision making (see chart for details).      Patient Vitals for the past 24 hrs:  BP Temp Temp src Pulse Resp SpO2 Height Weight  02/16/17 1802 (!) 156/97 - - 88 19 94 % - -  02/16/17 1600 (!) 184/128 - - 89 - - - -  02/16/17 1559 - - - - - - 5\' 11"  (1.803 m) 127 kg (280 lb)  02/16/17 1557 (!) 209/128 98 F (36.7 C) Oral 89 18 96 % - -    7:22 PM Reevaluation with update and discussion. After  initial assessment and treatment, an updated evaluation reveals no change in clinical status.  He is able to ambulate easily.  He has been tolerating oral liquids here.  Findings discussed and questions answered. Mancel Bale      Final Clinical Impressions(s) / ED Diagnoses   Final diagnoses:  Hyperglycemia  Type 2 diabetes mellitus with complication, without long-term current use of insulin (HCC)    Per glycemia, likely related to body habitus, and poor diet control.  Patient  will require additional metformin dosing, for now, with close attention to follow-up care and increased hydration with water.  Doubt metabolic instability or impending vascular collapse.  Nursing Notes Reviewed/ Care Coordinated Applicable Imaging Reviewed Interpretation of Laboratory Data incorporated into ED treatment  The patient appears reasonably screened and/or stabilized for discharge and I doubt any other medical condition or other [facility] requiring further screening, evaluation, or treatment in the ED at this time prior to discharge.  Plan: Home Medications-increase metformin to 1000 mg twice a day; Home Treatments-low sugar diet, increase oral water intake; return here if the recommended treatment, does not improve the symptoms; Recommended follow up-PCP follow-up as soon as possible.   ED Discharge Orders        Ordered    metFORMIN (GLUCOPHAGE) 500 MG tablet  2 times daily with meals,   Status:  Discontinued     02/16/17 1921    metFORMIN (GLUCOPHAGE) 500 MG tablet  2 times daily with meals,   Status:  Discontinued     02/16/17 1924    metFORMIN (GLUCOPHAGE) 1000 MG tablet  2 times daily     02/16/17 1926       Mancel BaleWentz, Kamya Watling, MD 02/16/17 Margretta [id number]    Mancel BaleWentz, Macsen Nuttall, MD 02/16/17 [phone]

## 2017-02-16 NOTE — Discharge Instructions (Signed)
Increase your Metformin dosing to 1000 mg twice a day.  Make sure you are drinking plenty of water each day.  Avoid high sugar foods and drinks.  Use the attached information regarding diabetes diet.  Follow-up with a primary care doctor soon as you can for further evaluation and treatment.

## 2017-12-27 ENCOUNTER — Encounter: Payer: Self-pay | Admitting: Gastroenterology

## 2018-03-18 ENCOUNTER — Ambulatory Visit (INDEPENDENT_AMBULATORY_CARE_PROVIDER_SITE_OTHER): Payer: Medicaid Other | Admitting: Gastroenterology

## 2018-03-18 ENCOUNTER — Encounter: Payer: Self-pay | Admitting: Gastroenterology

## 2018-03-18 ENCOUNTER — Other Ambulatory Visit: Payer: Self-pay

## 2018-03-18 VITALS — BP 202/133 | HR 86 | Temp 98.8°F | Ht 71.0 in | Wt 312.6 lb

## 2018-03-18 DIAGNOSIS — K529 Noninfective gastroenteritis and colitis, unspecified: Secondary | ICD-10-CM | POA: Diagnosis not present

## 2018-03-18 DIAGNOSIS — K625 Hemorrhage of anus and rectum: Secondary | ICD-10-CM | POA: Diagnosis not present

## 2018-03-18 MED ORDER — DICYCLOMINE HCL 10 MG PO CAPS: 10.0000 mg | ORAL_CAPSULE | Freq: Three times a day (TID) | ORAL | 3 refills | Status: DC

## 2018-03-18 NOTE — Patient Instructions (Signed)
I will see you back in 6 weeks to make sure your blood pressure is controlled.  Please see Dr. Janeece Riggers before returning back so that your blood pressure medication can be adjusted as needed.  We will try to hold a date for you in the future but will not be able to pursue a colonoscopy until blood pressure controlled.  I am retrieving ultrasound and labs from your primary care doctor in the meantime.  It was a pleasure to see you today. I strive to create trusting relationships with patients to provide genuine, compassionate, and quality care. I value your feedback. If you receive a survey regarding your visit,  I greatly appreciate you taking time to fill this out.   Gelene Mink, PhD, ANP-BC [facility]

## 2018-03-18 NOTE — Progress Notes (Signed)
Primary Care Physician:  Zhou-Talbert, Ralene Bathe, MD Primary Gastroenterologist:  Dr. Darrick Penna   Chief Complaint  Patient presents with  . Rectal Bleeding    intermittent for last 5 years    HPI:   Dylan Ross is a 43 y.o. male presenting today at the request of Dr. Janeece Riggers due to rectal bleeding. No prior colonoscopy. BP markedly elevated with systolic in 200s and diastolic 130s. I advised him to go to the ED and postpone this office visit, but he has declined several times. He understands the risk of cardiovascular compromise with postponing urgent evaluation. Currently, he is asymptomatic. He also tells me this is "normal for him". Single dad raising kids. States he forgot to take his medication today. As he is fully aware of risks and still declining urgent evaluation, we completed office visit. I have also told him we will not be able to perform any endoscopic procedure until hypertension is controlled and managed. He is aware.    Used to drink liquour up to gallon a week. Quit large intake about 5 years ago. Drinks beer now every now and then. If drinking lots of beer, will start having rectal bleeding.   Will have BMs up to 10-15 times a day with urgency since age 13. Soft, sometimes watery. Hasn't had a solid BM since he was 18. Stool has been ribbon shaped for years. No abdominal pain associated. Intermittent rectal bleeding. Has bled for 2 weeks at a time. Sometimes light, sometimes bowel movements coated. No rectal discomfort or itching. Dark burgundy at times. Some clots. No prior colonoscopy. Takes Imodium as needed. Declining any supportive prescriptive agents.   No dysphagia. Occasional reflux every now and then. Feels gassy at night lately. Has RLQ/right-sided abdominal pain when getting up from mattress and relieved after BMs. Bad gas. Loves milk. Buys 4 gallons of milk at grocery store. Takes Aleve every once in awhile for headaches. No aspirin powders.  No prior EGD.  Thinks he has had an ulcer and points to epigastric area. Has flared up in the past when drinking liquor. CURRENTLY NO SYMPTOMS. No PPI. Skipping meals. Only eats when hungry. Doesn't want to take any meds currently.    Kids 18 and 16.    Past Medical History:  Diagnosis Date  . Diabetes mellitus without complication (HCC)   . Diverticulosis   . Hypertension   . Kidney stones     Past Surgical History:  Procedure Laterality Date  . right inguinal hernia repair      Current Outpatient Medications  Medication Sig Dispense Refill  . atenolol (TENORMIN) 50 MG tablet Take 50 mg by mouth 2 (two) times daily.    Marland Kitchen gabapentin (NEURONTIN) 400 MG capsule Take 400 mg by mouth.    Marland Kitchen glipiZIDE (GLUCOTROL) 5 MG tablet Take 5 mg by mouth daily before breakfast. Pt said not taking. Pharmacist said last got #90 in November 2019    . hydrochlorothiazide (MICROZIDE) 12.5 MG capsule Take 12.5 mg by mouth 2 (two) times daily. Pharmacist said he last got #90 on Nov 2019    . liraglutide (VICTOZA) 18 MG/3ML SOPN Inject 1.2 mg into the skin daily.    Marland Kitchen lisinopril (PRINIVIL,ZESTRIL) 20 MG tablet Take 40 mg by mouth daily.   0  . metFORMIN (GLUCOPHAGE) 1000 MG tablet Take 1 tablet (1,000 mg total) by mouth 2 (two) times daily. 60 tablet 3   No current facility-administered medications for this visit.     Allergies as  of 03/18/2018  . (No Known Allergies)    Family History  Problem Relation Age of Onset  . Stroke Mother   . Cancer Mother   . Colon polyps Mother        age greater than 33  . Cirrhosis Father   . Colon cancer Neg Hx     Social History   Socioeconomic History  . Marital status: Legally Separated    Spouse name: Not on file  . Number of children: Not on file  . Years of education: Not on file  . Highest education level: Not on file  Occupational History  . Occupation: unemployed    Comment: looking for job   Social Needs  . Financial resource strain: Not on file  . Food  insecurity:    Worry: Not on file    Inability: Not on file  . Transportation needs:    Medical: Not on file    Non-medical: Not on file  Tobacco Use  . Smoking status: Current Every Day Smoker    Types: Cigarettes  . Smokeless tobacco: Never Used  . Tobacco comment: 0.5 to a whole pack a day.   Substance and Sexual Activity  . Alcohol use: Yes  . Drug use: Yes    Types: Marijuana    Comment: week ago  . Sexual activity: Yes  Lifestyle  . Physical activity:    Days per week: Not on file    Minutes per session: Not on file  . Stress: Not on file  Relationships  . Social connections:    Talks on phone: Not on file    Gets together: Not on file    Attends religious service: Not on file    Active member of club or organization: Not on file    Attends meetings of clubs or organizations: Not on file    Relationship status: Not on file  . Intimate partner violence:    Fear of current or ex partner: Not on file    Emotionally abused: Not on file    Physically abused: Not on file    Forced sexual activity: Not on file  Other Topics Concern  . Not on file  Social History Narrative  . Not on file    Review of Systems: Gen: Denies any fever, chills, fatigue, weight loss, lack of appetite.  CV: Denies chest pain, heart palpitations, peripheral edema, syncope.  Resp: Denies shortness of breath at rest or with exertion. Denies wheezing or cough.  GI: see HPI GU : Denies urinary burning, urinary frequency, urinary hesitancy MS: Denies joint pain, muscle weakness, cramps, or limitation of movement.  Derm: Denies rash, itching, dry skin Psych: Denies depression, anxiety, memory loss, and confusion Heme: see HPI  Physical Exam: BP (!) 202/133   Pulse 86   Temp 98.8 F (37.1 C) (Oral)   Ht 5\' 11"  (1.803 m)   Wt (!) 312 lb 9.6 oz (141.8 kg)   BMI 43.60 kg/m  General:   Alert and oriented. Pleasant and cooperative. Well-nourished and well-developed.  Head:  Normocephalic and  atraumatic. Eyes:  Without icterus, sclera clear and conjunctiva pink.  Ears:  Normal auditory acuity. Nose:  No deformity, discharge,  or lesions. Mouth:  No deformity or lesions, oral mucosa pink.  Lungs:  Clear to auscultation bilaterally. No wheezes, rales, or rhonchi. No distress.  Heart:  S1, S2 present without murmurs appreciated.  Abdomen:  +BS, soft, non-tender and non-distended. No HSM noted. No guarding or rebound. No  masses appreciated.  Rectal:  Deferred  Msk:  Symmetrical without gross deformities. Normal posture. Extremities:  Without edema. Neurologic:  Alert and  oriented x4 Psych:  Alert and cooperative. Normal mood and affect.

## 2018-03-21 ENCOUNTER — Encounter: Payer: Self-pay | Admitting: Gastroenterology

## 2018-03-21 NOTE — Assessment & Plan Note (Addendum)
Dating back to age 43, alternating between soft and watery. Colonoscopy to be performed in future. Declining any supportive agents. I am requesting blood work, ultrasound from PCP for our records.   Addendum: received blood work dated Dec 2019. Tbili 0.6, Alk Phos 91, AST 27, ALT 69, creatinine 0.92, A1c 8.6. May 2019 Hep B surface antigen non-reactive, Hep C antibody negative, ferritin elevated at 612 with sats 31, Total iron 128. Will need to recheck when he is not drinking alcohol as suspect this is reactive.  US abdomen May 2019 at [facility]: liver with hepatic steatosis, patent portal vein, normal gallbladder, spleen normal size, nonobstructing left renal stone.

## 2018-03-21 NOTE — Assessment & Plan Note (Signed)
43 year old male with long-standing history of diarrhea since age 21, now with chronic, intermittent rectal bleeding. No prior colonoscopy. Declining any supportive prescriptive agents. Needs diagnostic colonoscopy; however, he is presenting with hypertensive urgency, asymptomatic, and declining urgent evaluation. He states this is not uncommon for him, and he also has not taken all of his anti-hypertensives today. Despite my recommendations for evaluation and treatment, postponing office visit, he is declining any further evaluation. He is agreeable to contact his PCP and arrange visit for titration of medications, and he understands the risks of postponing evaluation. He is aware that we will not proceed with any elective diagnostic procedures until he has adequate control of hypertension. Declining any supportive measures today for diarrhea/rectal bleeding. Return in 6 weeks. We will tentatively hold a date with Dr. Darrick Penna a few months from now, but this may need to be postponed if he continues with uncontrolled hypertension.

## 2018-03-21 NOTE — Progress Notes (Signed)
cc'd to pcp 

## 2018-05-26 ENCOUNTER — Other Ambulatory Visit: Payer: Self-pay

## 2018-05-26 ENCOUNTER — Ambulatory Visit (INDEPENDENT_AMBULATORY_CARE_PROVIDER_SITE_OTHER): Payer: Medicaid Other | Admitting: Gastroenterology

## 2018-05-26 ENCOUNTER — Telehealth: Payer: Self-pay | Admitting: Gastroenterology

## 2018-05-26 ENCOUNTER — Encounter: Payer: Self-pay | Admitting: Gastroenterology

## 2018-05-26 DIAGNOSIS — K625 Hemorrhage of anus and rectum: Secondary | ICD-10-CM | POA: Diagnosis not present

## 2018-05-26 DIAGNOSIS — K219 Gastro-esophageal reflux disease without esophagitis: Secondary | ICD-10-CM | POA: Diagnosis not present

## 2018-05-26 DIAGNOSIS — K529 Noninfective gastroenteritis and colitis, unspecified: Secondary | ICD-10-CM

## 2018-05-26 MED ORDER — PANTOPRAZOLE SODIUM 40 MG PO TBEC: 40.0000 mg | DELAYED_RELEASE_TABLET | Freq: Every day | ORAL | 3 refills | Status: DC

## 2018-05-26 NOTE — Telephone Encounter (Signed)
Please see office note addendum. [facility] clinical pool, what date is being held tentatively for his colonoscopy?

## 2018-05-26 NOTE — Patient Instructions (Addendum)
1. Continue with imodium as needed for loose stools.   2. Pending better control of blood pressure will proceed with colonoscopy in the near future.    3. If you become symptomatic from blood loss including dizziness, lightheadedness, weakness, or feeling like you are going to pass out you should go to the emergency room.   4. I have sent in Protonix 40mg  to help with your reflux symptoms. Take this on empty stomach 30 minutes before dinner.   5. Also please see the attached GERD diet handout below.   6. Follow- up in 3 months.    Food Choices for Gastroesophageal Reflux Disease, Adult When you have gastroesophageal reflux disease (GERD), the foods you eat and your eating habits are very important. Choosing the right foods can help ease your discomfort. Think about working with a nutrition specialist (dietitian) to help you make good choices. What are tips for following this plan?   Meals  Choose healthy foods that are low in fat, such as fruits, vegetables, whole grains, low-fat dairy products, and lean meat, fish, and poultry.  Eat small meals often instead of 3 large meals a day. Eat your meals slowly, and in a place where you are relaxed. Avoid bending over or lying down until 2-3 hours after eating.  Avoid eating meals 2-3 hours before bed.  Avoid drinking a lot of liquid with meals.  Cook foods using methods other than frying. Bake, grill, or broil food instead.  Avoid or limit: ? Chocolate. ? Peppermint or spearmint. ? Alcohol. ? Pepper. ? Black and decaffeinated coffee. ? Black and decaffeinated tea. ? Bubbly (carbonated) soft drinks. ? Caffeinated energy drinks and soft drinks.  Limit high-fat foods such as: ? Fatty meat or fried foods. ? Whole milk, cream, butter, or ice cream. ? Nuts and nut butters. ? Pastries, donuts, and sweets made with butter or shortening.  Avoid foods that cause symptoms. These foods may be different for everyone. Common foods that  cause symptoms include: ? Tomatoes. ? Oranges, lemons, and limes. ? Peppers. ? Spicy food. ? Onions and garlic. ? Vinegar. Lifestyle  Maintain a healthy weight. Ask your doctor what weight is healthy for you. If you need to lose weight, work with your doctor to do so safely.  Exercise for at least 30 minutes for 5 or more days each week, or as told by your doctor.  Wear loose-fitting clothes.  Do not smoke. If you need help quitting, ask your doctor.  Sleep with the head of your bed higher than your feet. Use a wedge under the mattress or blocks under the bed frame to raise the head of the bed. Summary  When you have gastroesophageal reflux disease (GERD), food and lifestyle choices are very important in easing your symptoms.  Eat small meals often instead of 3 large meals a day. Eat your meals slowly, and in a place where you are relaxed.  Limit high-fat foods such as fatty meat or fried foods.  Avoid bending over or lying down until 2-3 hours after eating.  Avoid peppermint and spearmint, caffeine, alcohol, and chocolate. This information is not intended to replace advice given to you by your health care provider. Make sure you discuss any questions you have with your health care provider. Document Released: 06/23/2011 Document Revised: 01/28/2016 Document Reviewed: 01/28/2016 Elsevier Interactive Patient Education  2019 [employer].

## 2018-05-26 NOTE — Progress Notes (Addendum)
Referring Provider: Tanna FurryZhou-Talbert, Serena S, MD Primary Care Physician:  Tanna FurryZhou-Talbert, Serena S, MD  Primary GI: Dr. Jonette EvaSandi Fields  Patient Location: Home   [facility]   Reason for Visit: Follow-up Rectal Bleeding   Persons present on the virtual encounter, with roles: Ermalinda MemosKristen Harper, PA-C   Total time (minutes) spent on medical discussion: 30 minutes   Due to COVID-19, visit was conducted using virtual method.  Visit was requested by patient.  Virtual Visit via Telephone Note Due to COVID-19, visit is conducted virtually and was requested by patient.   I connected with Dylan Ross on 05/26/18 at  9:00 AM EDT by telephone and verified that I am speaking with the correct person using two identifiers.   I discussed the limitations, risks, security and privacy concerns of performing an evaluation and management service by telephone and the availability of in person appointments. I also discussed with the patient that there may be a patient responsible charge related to this service. The patient expressed understanding and agreed to proceed.  Chief Complaint  Patient presents with  . Rectal Bleeding    last episode last week, no straining with BM. Notices when he drinks a little more than normal     History of Present Illness:  Dylan Cedarddie Ross is a 43 year old male presenting for follow-up with rectal bleeding. Last seen in the office on 03/18/2018 for rectal bleeding with plans to pursue a colonoscopy; however, his blood pressure was uncontrolled at that time with systolic in the 200s and diastolic in the 130s. Patient said he had not taken his medication that morning and this was "normal for him." Plans for a diagnostic colonoscopy at that time was postponed until his blood pressure was better managed. Today he states that his blood pressure has "come down some".  Continues to have loose, ribbon like to watery stools 9-10 times per day. This has been persistent since  he was 18. Very rarely will he have a solid, formed BM. Uses imodium as needed if he is going to be away from the house for any length of time.   Rectal bleeding has continued and is unchanged from last visit. Hematochezia started about 6 years ago.  Doesn't occur daily. He may go a month or two without bleeding, but when it starts it will last 1-2 weeks. Last episode was last week. States toilet tissue will be covered in blood and the toilet water completely bloody. He has noticed that if he drinks a lot, about a 12 pack, he will have bleeding. 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 Occasionally he may "drink too much."  No associated abdominal or rectal pain. No N/V. Poor appetite. Only eats dinner. States he has been like this all of his adult life. No unintentional weight loss. He is trying to lose weight due to his history of diabetes, but hasn't lost any recently. No family history of colon cancer or IBD.   Intermittent acid reflux. Typically at night, waking him from sleep with acid coming out of his nose. Last episode last night after spaghetti leading to throwing up. Notes symptoms are worse with stress and fatty, greasy, tomato based foods. He tries to watch what he eats and has started eating more baked/broiled foods. Doesn't lay down within 3 hours of eating. Will drink milk, take Pepto, or use zantac to relieve symptoms as needed. Occasional use  of Aleve with elbow pain. No dysphagia. No epigastric pain.     Past Medical History:  Diagnosis Date  . Diabetes mellitus without complication (HCC)   . Diverticulosis   . Hypertension   . Kidney stones      Past Surgical History:  Procedure Laterality Date  . right inguinal hernia repair       Current Meds  Medication Sig  . gabapentin (NEURONTIN) 400 MG capsule Take 400 mg by mouth 3 (three) times daily.   Marland Kitchen glipiZIDE (GLUCOTROL) 10 MG tablet Take 10 mg by  mouth 2 (two) times daily before a meal.   . hydrochlorothiazide (MICROZIDE) 12.5 MG capsule Take 25 mg by mouth daily.   Marland Kitchen liraglutide (VICTOZA) 18 MG/3ML SOPN Inject 2.1 mg into the skin daily.   Marland Kitchen lisinopril (ZESTRIL) 40 MG tablet Take 40 mg by mouth daily.   . metFORMIN (GLUCOPHAGE) 1000 MG tablet Take 1 tablet (1,000 mg total) by mouth 2 (two) times daily.     Family History  Problem Relation Age of Onset  . Stroke Mother   . Cancer Mother   . Colon polyps Mother        age greater than 27  . Cirrhosis Father   . Colon cancer Neg Hx     Social History   Socioeconomic History  . Marital status: Legally Separated    Spouse name: Not on file  . Number of children: Not on file  . Years of education: Not on file  . Highest education level: Not on file  Occupational History  . Occupation: unemployed    Comment: looking for job   Social Needs  . Financial resource strain: Not on file  . Food insecurity:    Worry: Not on file    Inability: Not on file  . Transportation needs:    Medical: Not on file    Non-medical: Not on file  Tobacco Use  . Smoking status: Current Every Day Smoker    Types: Cigarettes  . Smokeless tobacco: Never Used  . Tobacco comment: 0.5 to a whole pack a day.   Substance and Sexual Activity  . Alcohol use: Yes    Comment: twice a week 12-24 ounces  . Drug use: Yes    Types: Marijuana    Comment: occasionally  . Sexual activity: Yes  Lifestyle  . Physical activity:    Days per week: Not on file    Minutes per session: Not on file  . Stress: Not on file  Relationships  . Social connections:    Talks on phone: Not on file    Gets together: Not on file    Attends religious service: Not on file    Active member of club or organization: Not on file    Attends meetings of clubs or organizations: Not on file    Relationship status: Not on file  Other Topics Concern  . Not on file  Social History Narrative  . Not on file       Review of  Systems: Gen: Denies fever, chills, or unintentional weight loss.  CV: Denies chest pain, palpitations, lightheadedness, dizziness, syncope. Resp: Denies dyspnea at rest. +smokers cough. GI: see HPI Psych: Denies depression, anxiety  Heme: see HPI  Observations/Objective: No distress. Laying in bed smoking a cigarette.   Assessment and Plan: 43 year old male with long history of diarrhea since he was 36 and a 6 year history of intermittent hematochezia. Suspect his chronic alcohol intake is  contributing to his loose stools and stool frequency. Does not want any prescriptive agent for supportive care as he says imodium keeps his stooling manageable. No prior colonoscopy. Pending blood pressure is better controlled, we will proceed with diagnostic colonoscopy in the near future with Dr. Darrick Penna. Will use propofol due to history of alcohol use. The risks, benefits, and alternatives have been discussed in detail with patient. They have stated understanding and desire to proceed.  Patient was advised if he becomes symptomatic from blood loss including dizziness, lightheadedness, weakness, or feeling like he is going to pass out, he should go to the emergency room.  Intermittent acid reflux worse at night and associated with specific food triggers. No alarm symptoms. He does have diabetes. Possible component of gastroparesis contributing to fullness all day and reflux symptoms after laying down several hours after meals. Suspect alcohol intake may also be contributing. Considering the severity of symptoms when they do occur, will start on Protonix 40 mg 30 minutes before dinner. Will also provide patient with a GERD handout.  History of mildly elevated ALT in December 2019 and noted hepatic steatosis on [place]. Will follow-up on this at next visit.    Addendum: Patient called back with blood pressure reading of 184/120. Patient states he saw his primary care after his last office visit and they increased  his HCTZ to 25mg . I advised patient to call his PCP back to discuss further management options as this is still too high to proceed with a procedure. Patient also stated he will stop drinking alcohol and be careful about his salt intake in efforts to reduce his BP. He is to call us back in 2 weeks with an update.   Follow Up Instructions: Follow-up in 3 months.    I discussed the assessment and treatment plan with the patient. The patient was provided an opportunity to ask questions and all were answered. The patient agreed with the plan and demonstrated an understanding of the instructions.   The patient was advised to call back or seek an in-person evaluation if the symptoms worsen or if the condition fails to improve as anticipated.  I provided 30 minutes of non-face-to-face time during this encounter.  Ermalinda Memos, PA-C [facility]

## 2018-05-26 NOTE — Telephone Encounter (Signed)
Patent called back with his BP reading 184-120   [phone]

## 2018-05-31 ENCOUNTER — Encounter: Payer: Self-pay | Admitting: Gastroenterology

## 2018-05-31 NOTE — Progress Notes (Signed)
CC'ED TO PCP 

## 2018-05-31 NOTE — Telephone Encounter (Signed)
Date held is 6/23 at 8:30am

## 2018-06-07 NOTE — Telephone Encounter (Signed)
Spoke with Mr. Dylan Ross this afternoon. States his PCP called him and requested to see him in the office. He is supposed to call today to make the appointment. Thinks he will be able to get in this week. He states he has been taking his medications daily and hasn't missed any doses. He is also going to check his blood pressure with his sisters cuff at home later today. He is to call back tomorrow to let me know if he will be able to see his primary care this week. Still holding his procedure date for 6/23. This may have to be pushed back slightly depending on when he can see his PCP and the status of his BP.

## 2018-06-10 ENCOUNTER — Telehealth: Payer: Self-pay | Admitting: Gastroenterology

## 2018-06-11 NOTE — Telephone Encounter (Signed)
Opened in error

## 2018-06-14 NOTE — Telephone Encounter (Signed)
I spoke to Dylan Ross. He said he had video chat with PCP today and was prescribed another blood pressure medication. He doesn't know the name of it because he has not picked it up yet. Michela Pitcher he has appointment with her on 06/22/2018. He said his blood pressure did better for awhile and then spiked up again.  He is aware that his procedure may have to be rescheduled.

## 2018-06-14 NOTE — Telephone Encounter (Signed)
Doris, could you check in with Dylan Ross to see if he has an appointment with his PCP for further evaluation of his hypertension? We are needing his blood pressure to be better managed before pursuing the colonoscopy. His date is help for 6/23 at the moment, but I am going to have to make a decision soon as to whether this has to be moved back or not.

## 2018-06-15 NOTE — Telephone Encounter (Signed)
[facility] clinical pool, what would be the next earliest date we could get patient scheduled for a colonoscopy with propofol. His place is being held for 6/23 at this time, but I may have to change this. I will make a decision early next week, but I just wanted to see what the next earliest availability would be.

## 2018-06-15 NOTE — Telephone Encounter (Signed)
Next available would be 09/15/2018

## 2018-06-21 NOTE — Telephone Encounter (Signed)
Please advise if we still need to continue holding 6/23 date for patient procedure?

## 2018-06-21 NOTE — Telephone Encounter (Signed)
Patient's appt isn't until tomorrow with PCP. Forwarding to [place] regarding upcoming colonoscopy date.

## 2018-06-22 NOTE — Telephone Encounter (Signed)
Doris, could you follow up with Dylan Ross? He was supposed to be seeing his PCP today. Wondering if he has been seen and how his blood pressure is doing.

## 2018-06-22 NOTE — Telephone Encounter (Signed)
I called pt and left Vm for a return call. I called his PCP and he had an appointment today and NO SHOWED.

## 2018-06-22 NOTE — Telephone Encounter (Signed)
Ok, we will have to reschedule his colonoscopy. He really needs follow-up with primary care for better management of his BP so we can proceed with the colonoscopy in the future. I would like to have him complete a CBC due to his rectal bleeding in the meantime.   [facility] Clinical Pool: Can you reschedule patients colonoscopy?

## 2018-06-22 NOTE — Telephone Encounter (Signed)
LMOM for a return call.  

## 2018-06-22 NOTE — Telephone Encounter (Signed)
PT called and said he was supposed to have video chat today and he had left his phone at his sister's and did not get it back in time. He said his blood pressures have not been doing good anyway.

## 2018-06-23 ENCOUNTER — Other Ambulatory Visit: Payer: Self-pay

## 2018-06-23 DIAGNOSIS — K625 Hemorrhage of anus and rectum: Secondary | ICD-10-CM

## 2018-06-23 NOTE — Telephone Encounter (Signed)
Patient is scheduled to come back in for OV 09/01/2018 with AB. I have held a spot on schedule for procedure 10/04/2018 at 10:00am. We can place orders, give instructions once patient comes in for OV with AB.

## 2018-06-23 NOTE — Telephone Encounter (Signed)
Pt said he is on Video chat now with physician.

## 2018-06-23 NOTE — Telephone Encounter (Signed)
PT is aware we are holding off of the colonoscopy for now, but holding a time for him in Sept to schedule after he comes for Appointment in August with Vicente Males.   He prefers me mail the lab order to him and he is still at [facility].  I will put in the mail today.

## 2018-06-30 LAB — CBC WITH DIFFERENTIAL/PLATELET
Absolute Monocytes: 680 cells/uL (ref 200–950)
Basophils Absolute: 84 cells/uL (ref 0–200)
Basophils Relative: 1 %
Eosinophils Absolute: 160 cells/uL (ref 15–500)
Eosinophils Relative: 1.9 %
HCT: 44.1 % (ref 38.5–50.0)
Hemoglobin: 15.4 g/dL (ref 13.2–17.1)
Lymphs Abs: 2898 cells/uL (ref 850–3900)
MCH: 32 pg (ref 27.0–33.0)
MCHC: 34.9 g/dL (ref 32.0–36.0)
MCV: 91.7 fL (ref 80.0–100.0)
MPV: 9.5 fL (ref 7.5–12.5)
Monocytes Relative: 8.1 %
Neutro Abs: 4578 cells/uL (ref 1500–7800)
Neutrophils Relative %: 54.5 %
Platelets: 323 10*3/uL (ref 140–400)
RBC: 4.81 10*6/uL (ref 4.20–5.80)
RDW: 12.7 % (ref 11.0–15.0)
Total Lymphocyte: 34.5 %
WBC: 8.4 10*3/uL (ref 3.8–10.8)

## 2018-07-01 NOTE — Progress Notes (Signed)
Doris, please let patient know his CBC was normal. Hemoglobin is within normal limits. Although he is having rectal bleeding, he is not becoming anemic. We will proceed with his scheduled appointment in August.

## 2018-07-01 NOTE — Progress Notes (Signed)
Pt is aware and said his BP is coming down. He is aware of Ov in August.

## 2018-07-11 NOTE — Progress Notes (Signed)
No letter to mail.

## 2018-09-01 ENCOUNTER — Encounter: Payer: Self-pay | Admitting: Gastroenterology

## 2018-09-01 ENCOUNTER — Ambulatory Visit: Payer: Medicaid Other | Admitting: Gastroenterology

## 2018-09-01 ENCOUNTER — Other Ambulatory Visit: Payer: Self-pay

## 2018-09-01 ENCOUNTER — Telehealth: Payer: Self-pay | Admitting: Gastroenterology

## 2018-09-01 ENCOUNTER — Telehealth: Payer: Self-pay

## 2018-09-01 NOTE — Telephone Encounter (Signed)
PATIENT WAS A NO SHOW/NO ANSWER AND LETTER SENT  °

## 2018-09-01 NOTE — Telephone Encounter (Signed)
Tried to call pt at 8:47am to start virtual visit w/AB, no answer, LMOVM for return call. No return call as of 9:15am. Held spot for TCS 10/04/18 removed per AB.  Erline Levine, please no show pt for virtual visit.

## 2018-09-01 NOTE — Telephone Encounter (Signed)
NO SHOWED PATIENT AND SENT LETTER  

## 2021-04-03 ENCOUNTER — Other Ambulatory Visit: Payer: Self-pay

## 2021-04-03 ENCOUNTER — Observation Stay (HOSPITAL_COMMUNITY)
Admission: EM | Admit: 2021-04-03 | Discharge: 2021-04-07 | Disposition: A | Discharge status: Home / Self Care | Payer: Medicaid Other | Attending: Internal Medicine | Admitting: Internal Medicine

## 2021-04-03 ENCOUNTER — Emergency Department (HOSPITAL_COMMUNITY): Payer: Medicaid Other

## 2021-04-03 ENCOUNTER — Encounter (HOSPITAL_COMMUNITY): Payer: Self-pay

## 2021-04-03 DIAGNOSIS — E876 Hypokalemia: Secondary | ICD-10-CM | POA: Diagnosis present

## 2021-04-03 DIAGNOSIS — E119 Type 2 diabetes mellitus without complications: Secondary | ICD-10-CM | POA: Diagnosis not present

## 2021-04-03 DIAGNOSIS — Z7984 Long term (current) use of oral hypoglycemic drugs: Secondary | ICD-10-CM | POA: Diagnosis not present

## 2021-04-03 DIAGNOSIS — K219 Gastro-esophageal reflux disease without esophagitis: Secondary | ICD-10-CM | POA: Diagnosis present

## 2021-04-03 DIAGNOSIS — E781 Pure hyperglyceridemia: Secondary | ICD-10-CM | POA: Diagnosis not present

## 2021-04-03 DIAGNOSIS — Z79899 Other long term (current) drug therapy: Secondary | ICD-10-CM | POA: Insufficient documentation

## 2021-04-03 DIAGNOSIS — G4733 Obstructive sleep apnea (adult) (pediatric): Secondary | ICD-10-CM | POA: Diagnosis not present

## 2021-04-03 DIAGNOSIS — E1165 Type 2 diabetes mellitus with hyperglycemia: Secondary | ICD-10-CM

## 2021-04-03 DIAGNOSIS — F17210 Nicotine dependence, cigarettes, uncomplicated: Secondary | ICD-10-CM | POA: Diagnosis not present

## 2021-04-03 DIAGNOSIS — I639 Cerebral infarction, unspecified: Principal | ICD-10-CM | POA: Diagnosis present

## 2021-04-03 DIAGNOSIS — F17200 Nicotine dependence, unspecified, uncomplicated: Secondary | ICD-10-CM

## 2021-04-03 DIAGNOSIS — G8192 Hemiplegia, unspecified affecting left dominant side: Secondary | ICD-10-CM | POA: Diagnosis present

## 2021-04-03 DIAGNOSIS — E669 Obesity, unspecified: Secondary | ICD-10-CM | POA: Diagnosis not present

## 2021-04-03 DIAGNOSIS — Z794 Long term (current) use of insulin: Secondary | ICD-10-CM

## 2021-04-03 DIAGNOSIS — I10 Essential (primary) hypertension: Secondary | ICD-10-CM | POA: Diagnosis present

## 2021-04-03 DIAGNOSIS — I6340 Cerebral infarction due to embolism of unspecified cerebral artery: Secondary | ICD-10-CM

## 2021-04-03 HISTORY — DX: Sleep apnea, unspecified: G47.30

## 2021-04-03 HISTORY — DX: Family history of other specified conditions: Z84.89

## 2021-04-03 LAB — COMPREHENSIVE METABOLIC PANEL
ALT: 87 U/L — ABNORMAL HIGH (ref 0–44)
AST: 103 U/L — ABNORMAL HIGH (ref 15–41)
Albumin: 3.4 g/dL — ABNORMAL LOW (ref 3.5–5.0)
Alkaline Phosphatase: 85 U/L (ref 38–126)
Anion gap: 12 (ref 5–15)
BUN: 9 mg/dL (ref 6–20)
CO2: 23 mmol/L (ref 22–32)
Calcium: 9 mg/dL (ref 8.9–10.3)
Chloride: 97 mmol/L — ABNORMAL LOW (ref 98–111)
Creatinine, Ser: 0.88 mg/dL (ref 0.61–1.24)
GFR, Estimated: >60 mL/min (ref >60)
Glucose, Bld: 190 mg/dL — ABNORMAL HIGH (ref 70–99)
Potassium: 3.4 mmol/L — ABNORMAL LOW (ref 3.5–5.1)
Sodium: 132 mmol/L — ABNORMAL LOW (ref 135–145)
Total Bilirubin: 0.9 mg/dL (ref 0.3–1.2)
Total Protein: 6.8 g/dL (ref 6.5–8.1)

## 2021-04-03 LAB — CBC WITH DIFFERENTIAL/PLATELET
Abs Immature Granulocytes: 0.06 10*3/uL (ref 0.00–0.07)
Basophils Absolute: 0.1 10*3/uL (ref 0.0–0.1)
Basophils Relative: 1 %
Eosinophils Absolute: 0.2 10*3/uL (ref 0.0–0.5)
Eosinophils Relative: 2 %
HCT: 46 % (ref 39.0–52.0)
Hemoglobin: 16.6 g/dL (ref 13.0–17.0)
Immature Granulocytes: 1 %
Lymphocytes Relative: 27 %
Lymphs Abs: 2.8 10*3/uL (ref 0.7–4.0)
MCH: 32.8 pg (ref 26.0–34.0)
MCHC: 36.1 g/dL — ABNORMAL HIGH (ref 30.0–36.0)
MCV: 90.9 fL (ref 80.0–100.0)
Monocytes Absolute: 0.7 10*3/uL (ref 0.1–1.0)
Monocytes Relative: 7 %
Neutro Abs: 6.6 10*3/uL (ref 1.7–7.7)
Neutrophils Relative %: 62 %
Platelets: 308 10*3/uL (ref 150–400)
RBC: 5.06 MIL/uL (ref 4.22–5.81)
RDW: 13.2 % (ref 11.5–15.5)
WBC: 10.5 10*3/uL (ref 4.0–10.5)
nRBC: 0 % (ref 0.0–0.2)

## 2021-04-03 LAB — GLUCOSE, CAPILLARY: Glucose-Capillary: 154 mg/dL — ABNORMAL HIGH (ref 70–99)

## 2021-04-03 IMAGING — MR MR MRA HEAD W/O CM
1 series · 48 of 48 positions shown · IV contrast (gadavist)
Comparison: None.

CLINICAL DATA: Numbness to left side of head and face, stroke
suspected

EXAM:
MRI HEAD WITHOUT CONTRAST
MRA HEAD WITHOUT CONTRAST
MRA NECK WITHOUT AND WITH CONTRAST
TECHNIQUE: Multiplanar, multi-echo pulse sequences of the brain and surrounding
structures were acquired without intravenous contrast. Angiographic
images of the Circle of Willis were acquired using MRA technique
without intravenous contrast. Angiographic images of the neck were
acquired using MRA technique without and with intravenous contrast.
Carotid stenosis measurements (when applicable) are obtained
utilizing NASCET criteria, using the distal internal carotid
diameter as the denominator.
CONTRAST:  10 mL Gadavist

[Series 100: TOF · axial · 0.5mm · 0.76mm/px · z∈[-48,+38]mm · 48 of 184 slices shown]
[im 1/184]
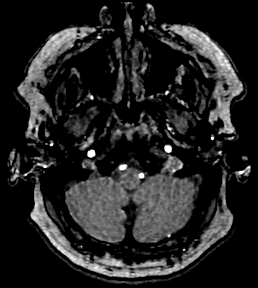
[im 4/184]
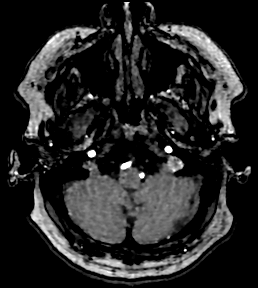
[im 8/184]
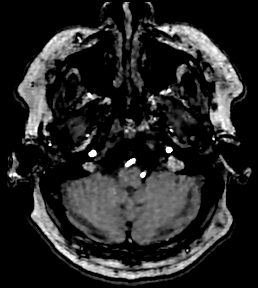
[im 12/184]
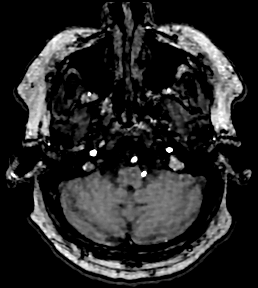
[im 16/184]
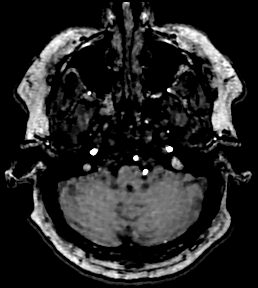
[im 20/184]
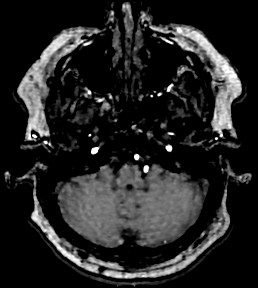
[im 24/184]
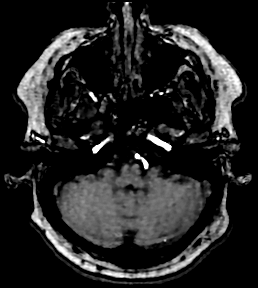
[im 28/184]
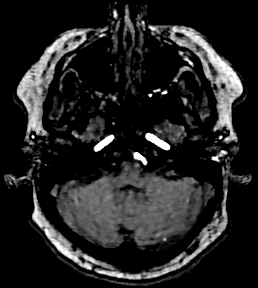
[im 32/184]
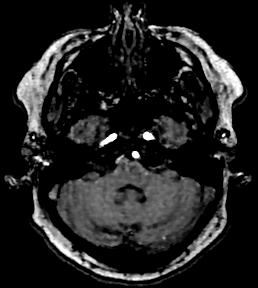
[im 36/184]
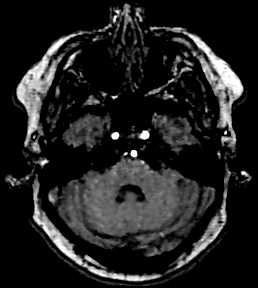
[im 39/184]
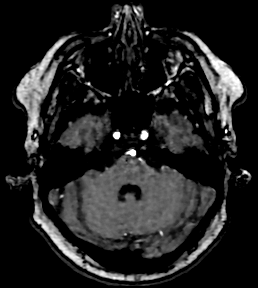
[im 43/184]
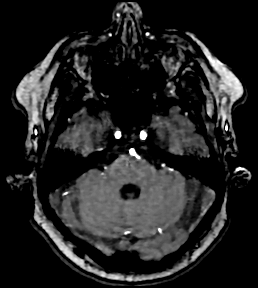
[im 47/184]
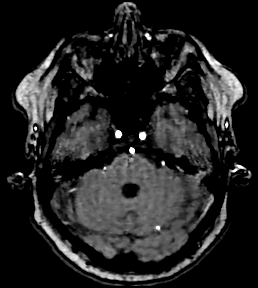
[im 51/184]
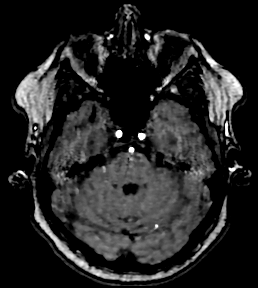
[im 55/184]
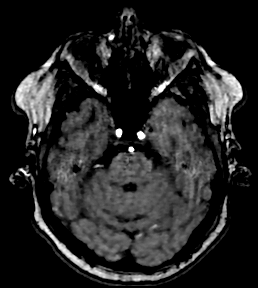
[im 59/184]
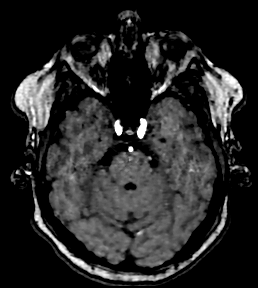
[im 63/184]
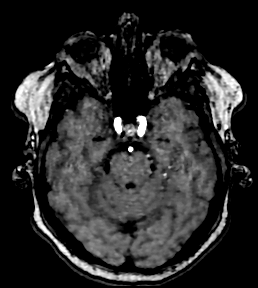
[im 67/184]
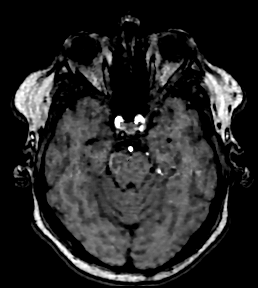
[im 71/184]
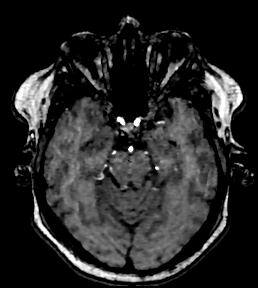
[im 74/184]
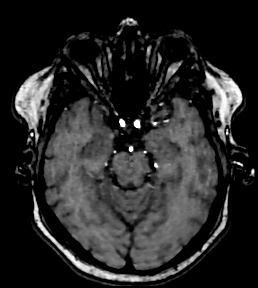
[im 78/184]
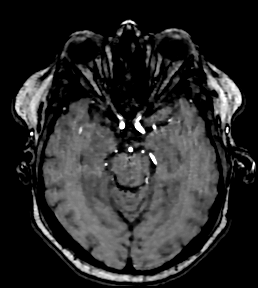
[im 82/184]
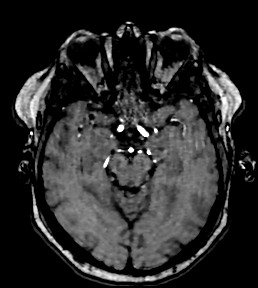
[im 86/184]
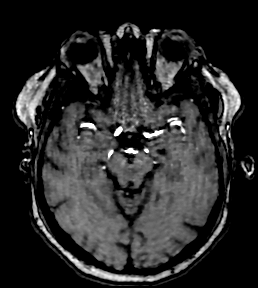
[im 90/184]
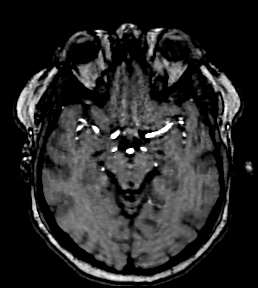
[im 94/184]
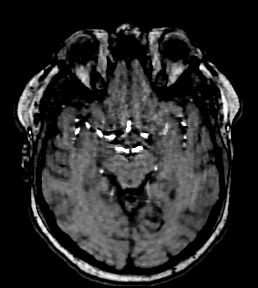
[im 98/184]
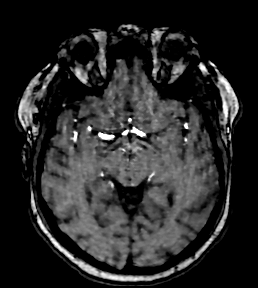
[im 102/184]
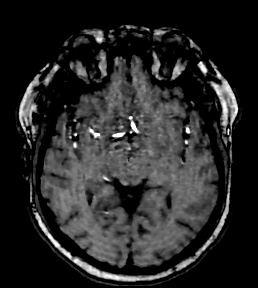
[im 106/184]
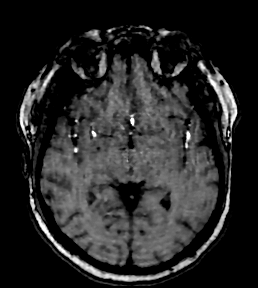
[im 110/184]
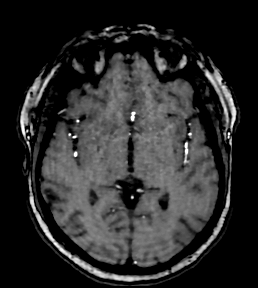
[im 113/184]
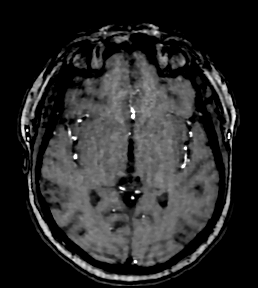
[im 117/184]
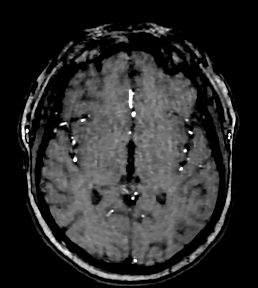
[im 121/184]
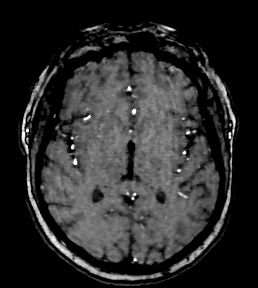
[im 125/184]
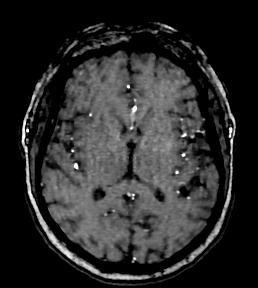
[im 129/184]
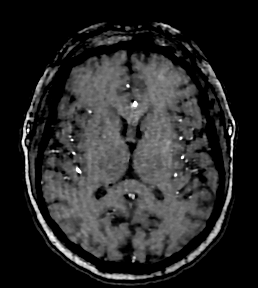
[im 133/184]
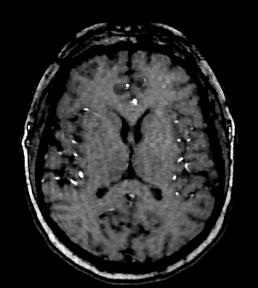
[im 137/184]
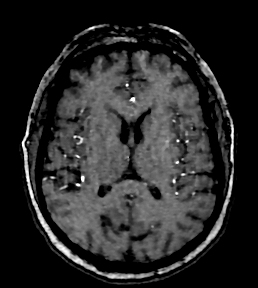
[im 141/184]
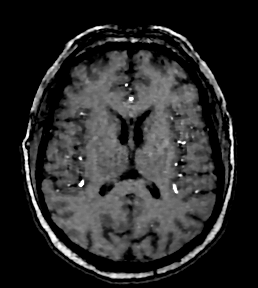
[im 145/184]
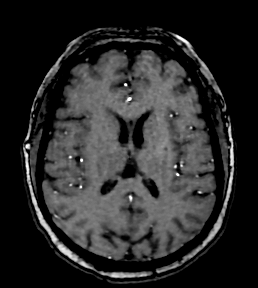
[im 148/184]
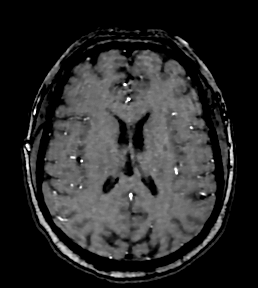
[im 152/184]
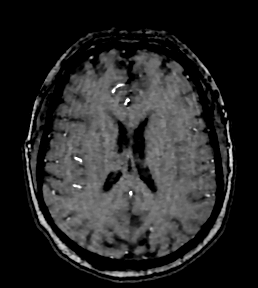
[im 156/184]
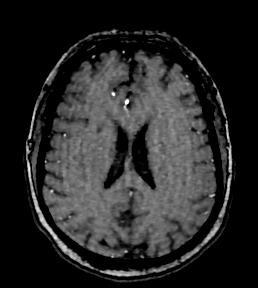
[im 160/184]
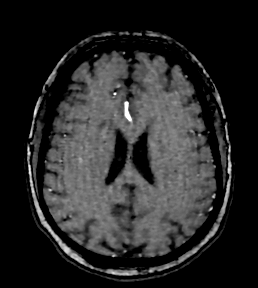
[im 164/184]
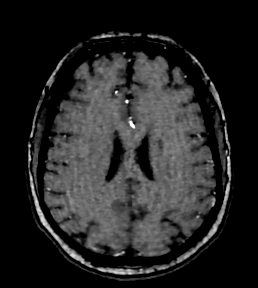
[im 168/184]
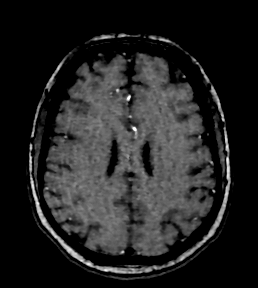
[im 172/184]
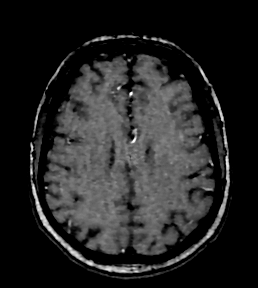
[im 176/184]
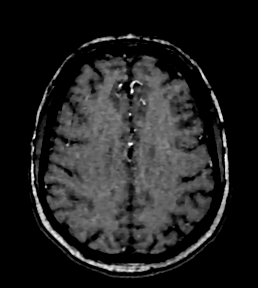
[im 180/184]
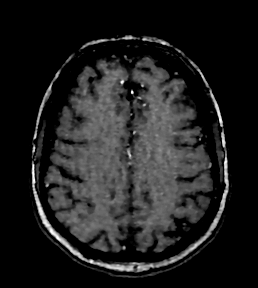
[im 184/184]
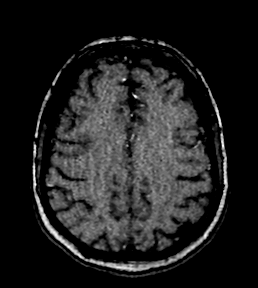

[48 of 48 positions shown; findings below may reference images not displayed]

FINDINGS: MRI HEAD FINDINGS

Brain: Focus of restricted diffusion with ADC correlate in the right
thalamus (series 5, image 16 and series 6, image 17). Additional
areas of increased signal on diffusion-weighted imaging in the
bilateral corona radiata do not have ADC correlates and are
associated with increased T2 signal, likely subacute infarcts with
some normalization of the ADC and/or T2 shine through.

No acute hemorrhage, mass, mass effect, or midline shift. Minimal T2
hyperintense foci, which may be the sequela of chronic small vessel
ischemic disease. No hydrocephalus or extra-axial collection.

Vascular: Normal flow voids.

Skull and upper cervical spine: Normal marrow signal.

Sinuses/Orbits: No acute finding.

Other: The mastoids are well aerated.

MRA HEAD FINDINGS

Anterior circulation: Both internal carotid arteries are patent to
the termini, without significant stenosis.

A1 segments patent. The anterior communicating artery is not
definitively visualized. Irregularity in the right A2 segment,
likely multifocal stenosis (series 100, image 106). Anterior
cerebral arteries are otherwise patent to their distal aspects.

No M1 stenosis or occlusion. Normal MCA bifurcations. Distal MCA
branches perfused, with multifocal irregularity in several right MCA
branches (series 100, images 95-108, 124-129, and 144-155).

Posterior circulation: Vertebral arteries patent to the
vertebrobasilar junction without stenosis. Basilar patent to its
distal aspect. Superior cerebellar arteries patent bilaterally.

Patent P1 segments. PCAs perfused to their distal aspects, with some
irregularity in the bilateral distal P2 and P3 segments (series 100,
image 87). The bilateral posterior communicating arteries are not
visualized.

Anatomic variants: None significant

MRA NECK FINDINGS

Aortic arch: Normal aortic branching. No evidence of dissection or
aneurysm.

Right carotid system: No evidence of dissection, occlusion, or
hemodynamically significant stenosis (greater than 50%).

Left carotid system: No evidence of dissection, occlusion, or
hemodynamically significant stenosis (greater than 50%).

Vertebral arteries: The origins of the vertebral arteries are not
well seen due to artifact. The remainder of the vertebral arteries
appear patent, without significant stenosis, dissection, or
occlusion.

Other: None
IMPRESSION: 1. Acute infarct in the right thalamus, with additional subacute to
late subacute infarcts in the bilateral corona radiata. Given the
patient's young age in multiple vascular territories, an embolic
etiology is suspected.
2. Multifocal irregularity in the right A2, right MCA branches, and
bilateral P2 segments, which may indicate multifocal narrowing but
could also be artifactual.
3. No hemodynamically significant stenosis in the neck.

with provider [HOSPITAL] VIK.

## 2021-04-03 IMAGING — MR MR HEAD W/O CM
11 of 13 series · 29 of 48 positions shown · IV contrast (gadavist)
Comparison: None.

CLINICAL DATA: Numbness to left side of head and face, stroke
suspected

EXAM:
MRI HEAD WITHOUT CONTRAST
MRA HEAD WITHOUT CONTRAST
MRA NECK WITHOUT AND WITH CONTRAST
TECHNIQUE: Multiplanar, multi-echo pulse sequences of the brain and surrounding
structures were acquired without intravenous contrast. Angiographic
images of the Circle of Willis were acquired using MRA technique
without intravenous contrast. Angiographic images of the neck were
acquired using MRA technique without and with intravenous contrast.
Carotid stenosis measurements (when applicable) are obtained
utilizing NASCET criteria, using the distal internal carotid
diameter as the denominator.
CONTRAST:  10 mL Gadavist

[Series 5: DWI · axial · 4.0mm · 0.88mm/px · z∈[-54,+86]mm · 3 of 36 slices shown (1 of 6)]
[im 1/36]
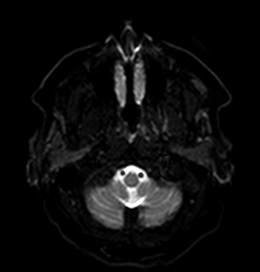
[im 18/36]
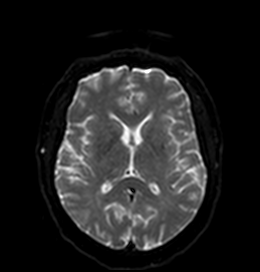
[im 36/36]
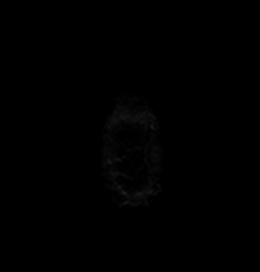

[Series 5: DWI · axial · 4.0mm · 0.88mm/px · z∈[-54,+86]mm · 3 of 36 slices shown (2 of 6)]
[im 1/36]
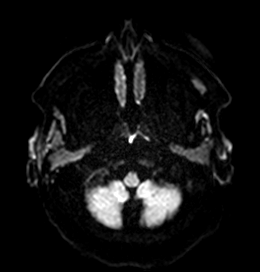
[im 18/36]
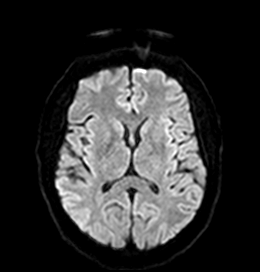
[im 36/36]
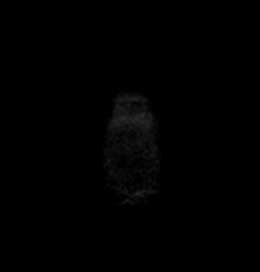

[Series 6: DWI · axial · 4.0mm · 0.88mm/px · z∈[-54,+86]mm · 3 of 36 slices shown (3 of 6)]
[im 1/36]
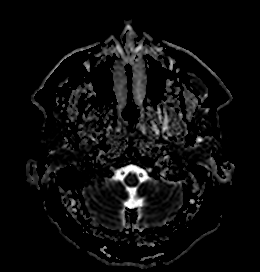
[im 18/36]
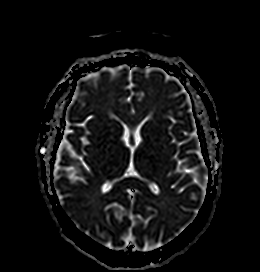
[im 36/36]
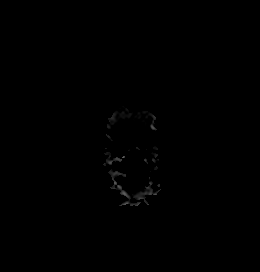

[Series 7: T1 · sagittal · 5.0mm · 0.80mm/px · 2 of 23 slices shown]
[im 1/23]
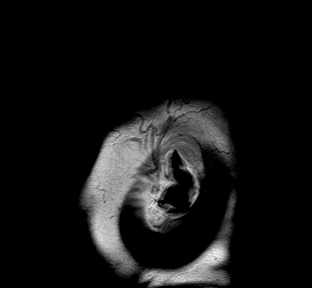
[im 23/23]
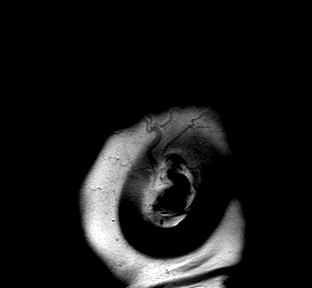

[Series 8: DWI · coronal · 4.0mm · 0.88mm/px · 3 of 35 slices shown (4 of 6)]
[im 1/35]
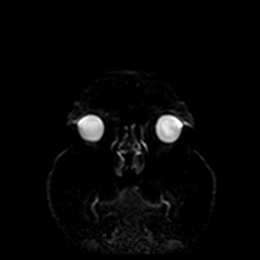
[im 18/35]
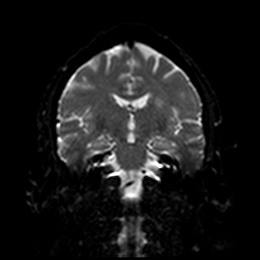
[im 35/35]
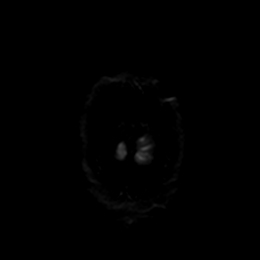

[Series 8: DWI · coronal · 4.0mm · 0.88mm/px · 3 of 35 slices shown (5 of 6)]
[im 1/35]
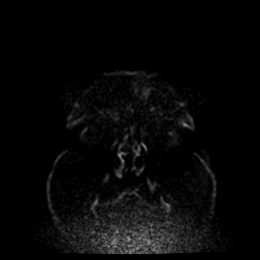
[im 18/35]
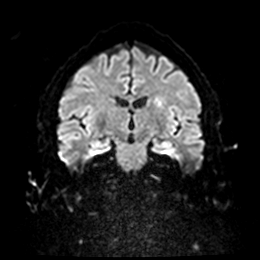
[im 35/35]
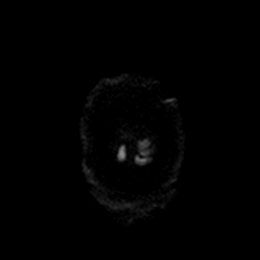

[Series 9: DWI · coronal · 4.0mm · 0.88mm/px · 3 of 35 slices shown (6 of 6)]
[im 1/35]
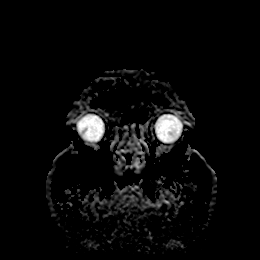
[im 18/35]
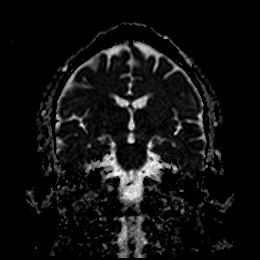
[im 35/35]
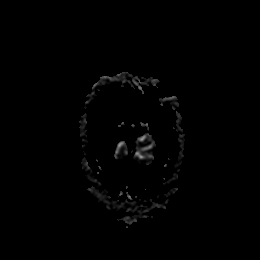

[Series 21: T2 · axial · 5.0mm · 0.72mm/px · z∈[-61,+93]mm · 2 of 23 slices shown (1 of 2)]
[im 1/23]
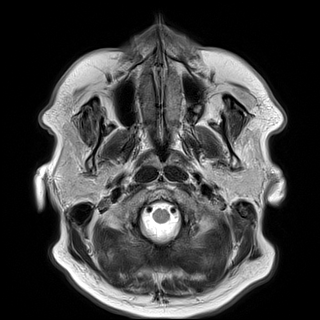
[im 23/23]
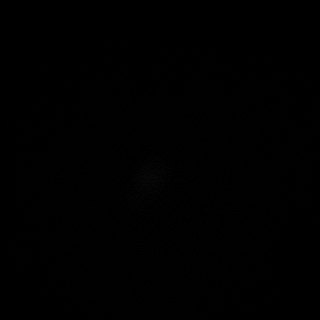

[Series 25: ax hemo · axial · 5.0mm · 0.86mm/px · z∈[-56,+88]mm · 2 of 25 slices shown]
[im 1/25]
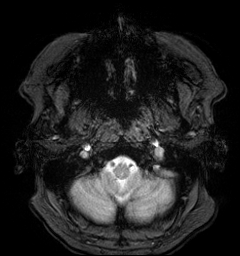
[im 25/25]
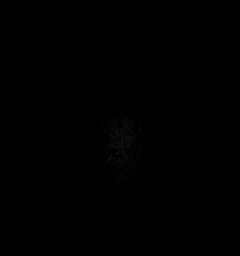

[Series 26: FLAIR · axial · 4.0mm · 0.43mm/px · z∈[-58,+90]mm · 3 of 38 slices shown]
[im 1/38]
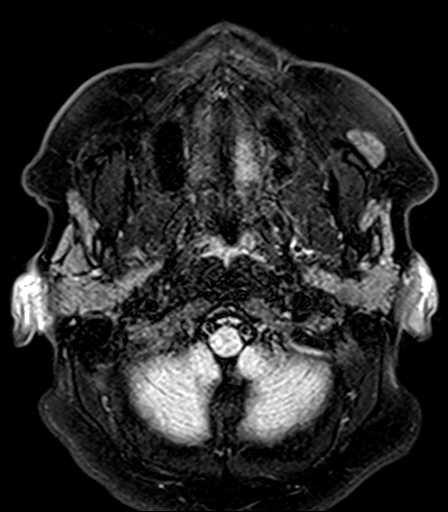
[im 19/38]
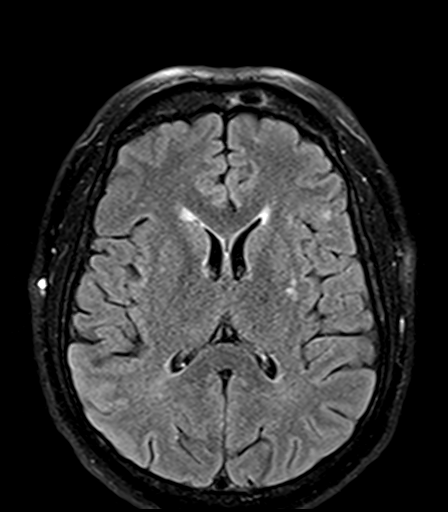
[im 38/38]
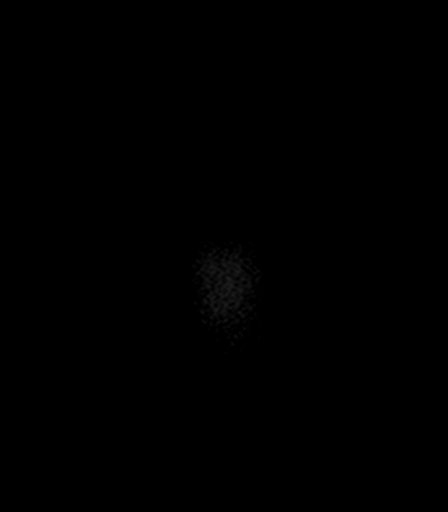

[Series 28: T2 · coronal · 5.0mm · 0.72mm/px · 2 of 28 slices shown (2 of 2)]
[im 1/28]
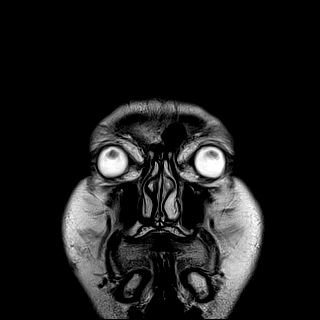
[im 28/28]
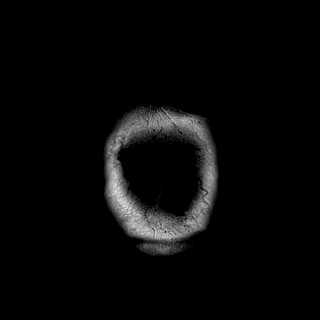

[29 of 48 positions shown; findings below may reference images not displayed]

FINDINGS: MRI HEAD FINDINGS

Brain: Focus of restricted diffusion with ADC correlate in the right
thalamus (series 5, image 16 and series 6, image 17). Additional
areas of increased signal on diffusion-weighted imaging in the
bilateral corona radiata do not have ADC correlates and are
associated with increased T2 signal, likely subacute infarcts with
some normalization of the ADC and/or T2 shine through.

No acute hemorrhage, mass, mass effect, or midline shift. Minimal T2
hyperintense foci, which may be the sequela of chronic small vessel
ischemic disease. No hydrocephalus or extra-axial collection.

Vascular: Normal flow voids.

Skull and upper cervical spine: Normal marrow signal.

Sinuses/Orbits: No acute finding.

Other: The mastoids are well aerated.

MRA HEAD FINDINGS

Anterior circulation: Both internal carotid arteries are patent to
the termini, without significant stenosis.

A1 segments patent. The anterior communicating artery is not
definitively visualized. Irregularity in the right A2 segment,
likely multifocal stenosis (series 100, image 106). Anterior
cerebral arteries are otherwise patent to their distal aspects.

No M1 stenosis or occlusion. Normal MCA bifurcations. Distal MCA
branches perfused, with multifocal irregularity in several right MCA
branches (series 100, images 95-108, 124-129, and 144-155).

Posterior circulation: Vertebral arteries patent to the
vertebrobasilar junction without stenosis. Basilar patent to its
distal aspect. Superior cerebellar arteries patent bilaterally.

Patent P1 segments. PCAs perfused to their distal aspects, with some
irregularity in the bilateral distal P2 and P3 segments (series 100,
image 87). The bilateral posterior communicating arteries are not
visualized.

Anatomic variants: None significant

MRA NECK FINDINGS

Aortic arch: Normal aortic branching. No evidence of dissection or
aneurysm.

Right carotid system: No evidence of dissection, occlusion, or
hemodynamically significant stenosis (greater than 50%).

Left carotid system: No evidence of dissection, occlusion, or
hemodynamically significant stenosis (greater than 50%).

Vertebral arteries: The origins of the vertebral arteries are not
well seen due to artifact. The remainder of the vertebral arteries
appear patent, without significant stenosis, dissection, or
occlusion.

Other: None
IMPRESSION: 1. Acute infarct in the right thalamus, with additional subacute to
late subacute infarcts in the bilateral corona radiata. Given the
patient's young age in multiple vascular territories, an embolic
etiology is suspected.
2. Multifocal irregularity in the right A2, right MCA branches, and
bilateral P2 segments, which may indicate multifocal narrowing but
could also be artifactual.
3. No hemodynamically significant stenosis in the neck.

with provider [HOSPITAL] VIK.

## 2021-04-03 IMAGING — MR MR MRA NECK WO/W CM
3 series · 39 of 48 positions shown · IV contrast (10 ml Gadavist)
Comparison: None.

CLINICAL DATA: Numbness to left side of head and face, stroke
suspected

EXAM:
MRI HEAD WITHOUT CONTRAST
MRA HEAD WITHOUT CONTRAST
MRA NECK WITHOUT AND WITH CONTRAST
TECHNIQUE: Multiplanar, multi-echo pulse sequences of the brain and surrounding
structures were acquired without intravenous contrast. Angiographic
images of the Circle of Willis were acquired using MRA technique
without intravenous contrast. Angiographic images of the neck were
acquired using MRA technique without and with intravenous contrast.
Carotid stenosis measurements (when applicable) are obtained
utilizing NASCET criteria, using the distal internal carotid
diameter as the denominator.
CONTRAST:  10 mL Gadavist

[Series 101: angio_fl3d_cor_post_ttc=3.0s_moco-adv_sub · coronal · 0.9mm · 0.85mm/px · 13 of 75 slices shown]
[im 1/75]
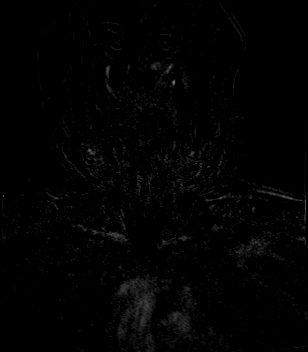
[im 7/75]
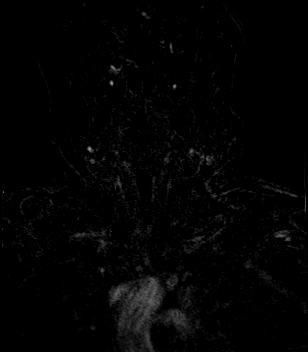
[im 13/75]
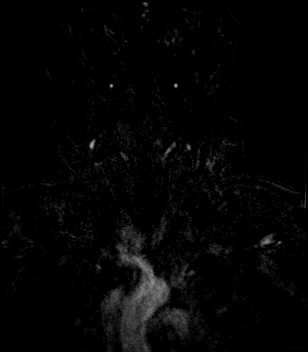
[im 19/75]
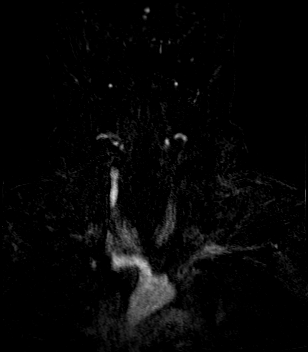
[im 25/75]
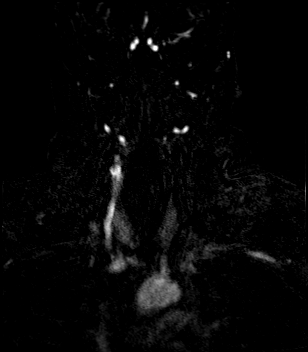
[im 31/75]
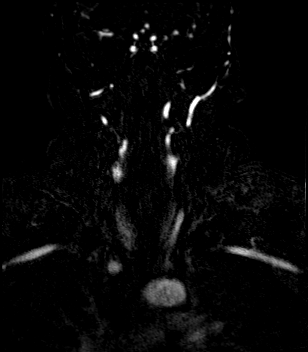
[im 38/75]
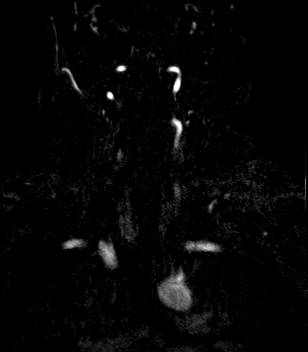
[im 44/75]
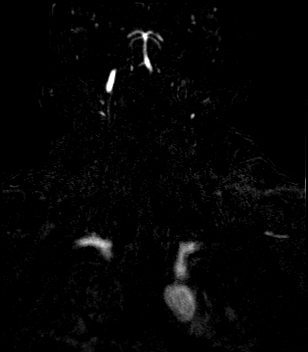
[im 50/75]
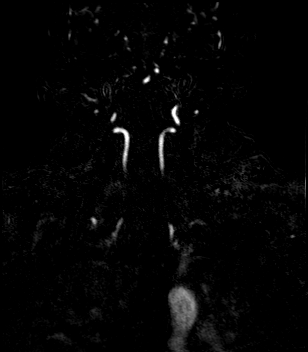
[im 56/75]
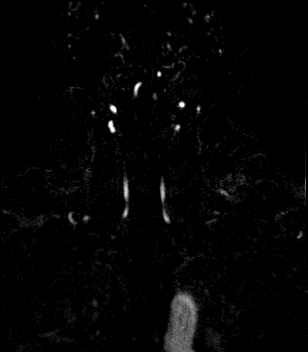
[im 62/75]
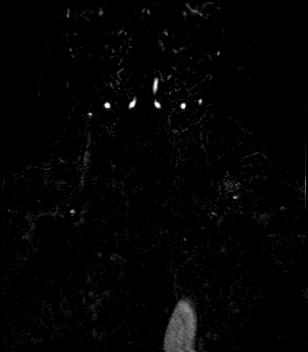
[im 68/75]
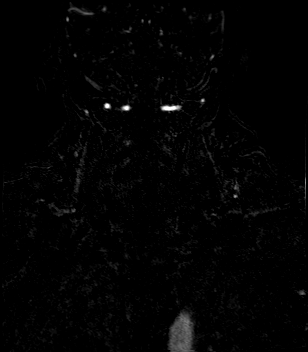
[im 75/75]
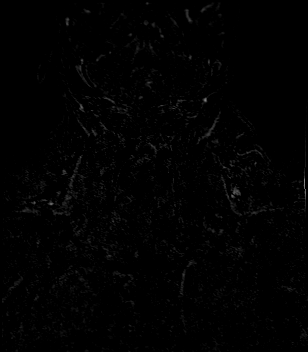

[Series 102: angio_fl3d_cor_post_ttc=3.0s_moco-adv · coronal · 0.9mm · 0.85mm/px · 13 of 80 slices shown]
[im 1/80]
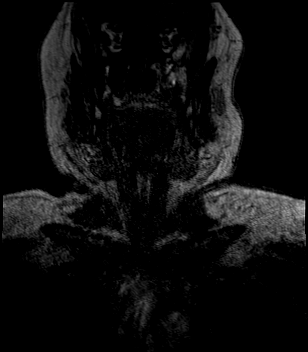
[im 7/80]
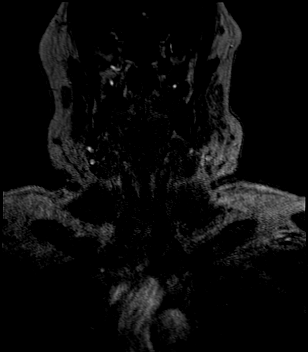
[im 14/80]
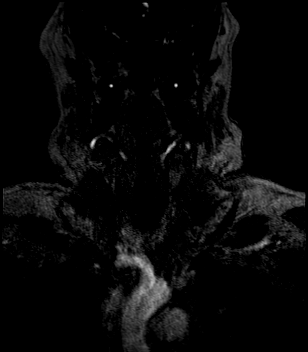
[im 20/80]
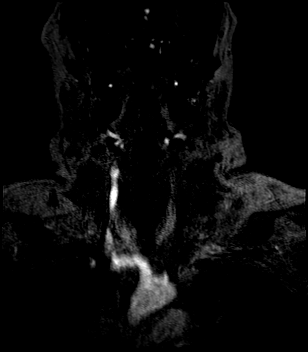
[im 27/80]
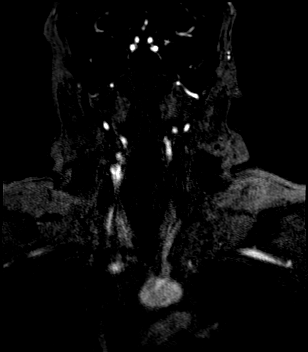
[im 33/80]
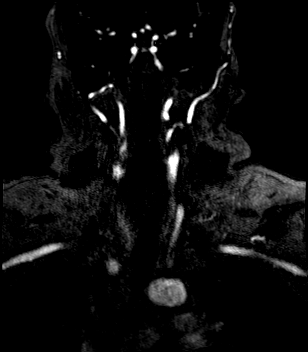
[im 40/80]
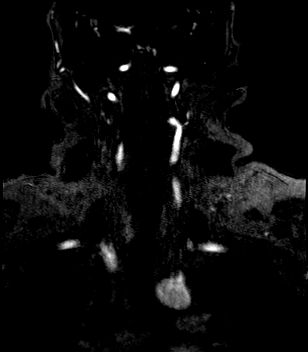
[im 47/80]
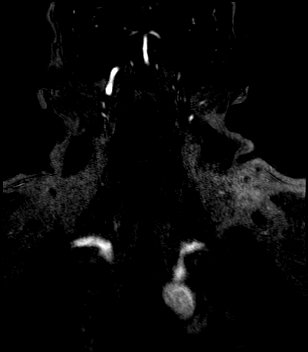
[im 53/80]
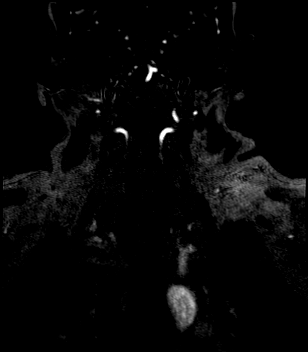
[im 60/80]
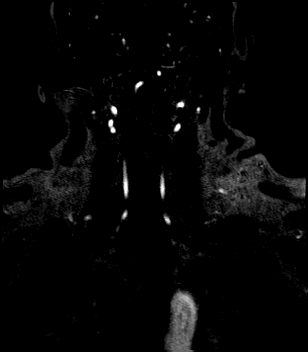
[im 66/80]
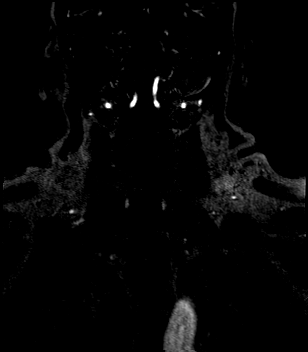
[im 73/80]
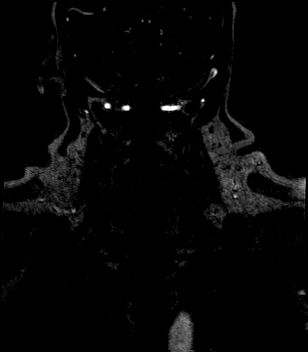
[im 80/80]
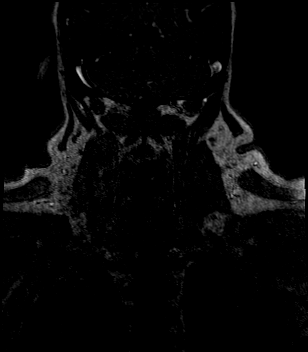

[Series 113: tof_fl3d_tra_iso · axial · 0.6mm · 0.52mm/px · z∈[-162,-87]mm · 13 of 133 slices shown]
[im 1/133]
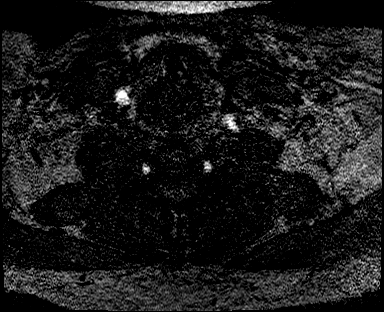
[im 7/133]
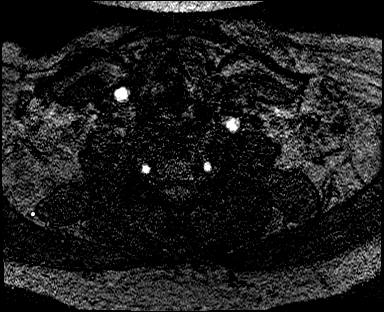
[im 13/133]
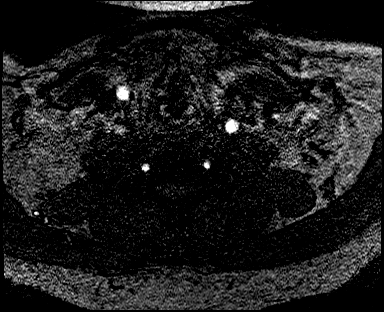
[im 19/133]
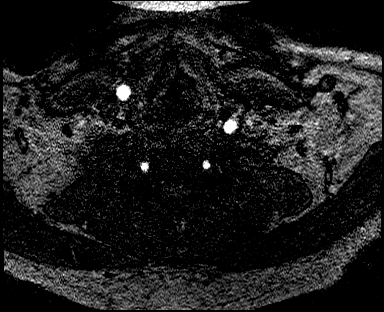
[im 26/133]
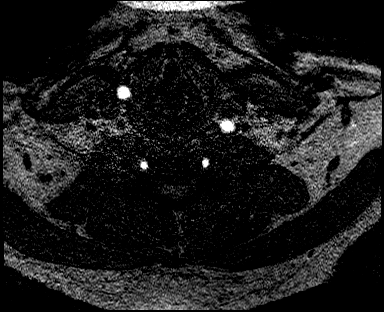
[im 38/133]
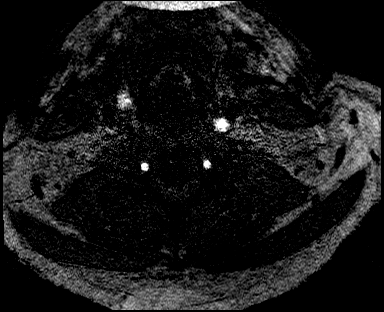
[im 57/133]
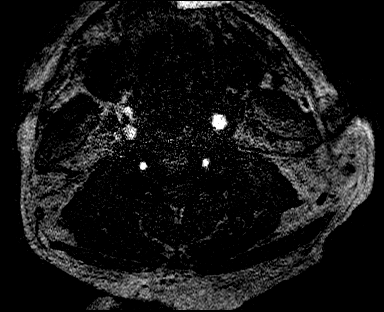
[im 70/133]
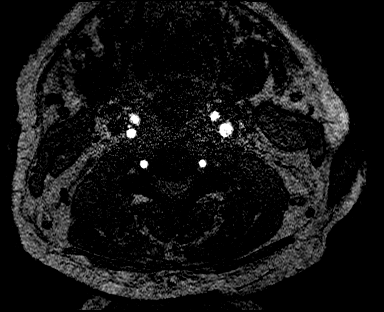
[im 76/133]
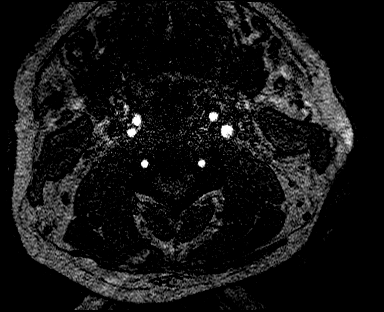
[im 95/133]
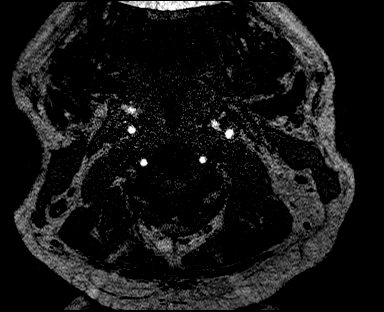
[im 107/133]
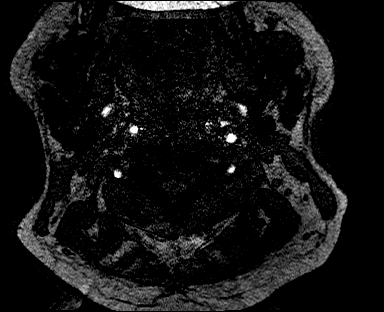
[im 114/133]
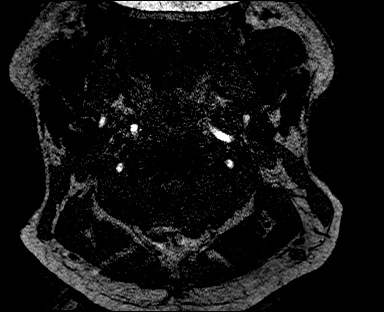
[im 126/133]
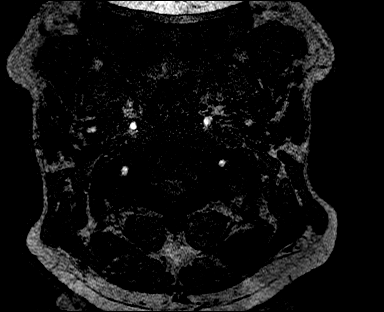

[39 of 48 positions shown; findings below may reference images not displayed]

FINDINGS: MRI HEAD FINDINGS

Brain: Focus of restricted diffusion with ADC correlate in the right
thalamus (series 5, image 16 and series 6, image 17). Additional
areas of increased signal on diffusion-weighted imaging in the
bilateral corona radiata do not have ADC correlates and are
associated with increased T2 signal, likely subacute infarcts with
some normalization of the ADC and/or T2 shine through.

No acute hemorrhage, mass, mass effect, or midline shift. Minimal T2
hyperintense foci, which may be the sequela of chronic small vessel
ischemic disease. No hydrocephalus or extra-axial collection.

Vascular: Normal flow voids.

Skull and upper cervical spine: Normal marrow signal.

Sinuses/Orbits: No acute finding.

Other: The mastoids are well aerated.

MRA HEAD FINDINGS

Anterior circulation: Both internal carotid arteries are patent to
the termini, without significant stenosis.

A1 segments patent. The anterior communicating artery is not
definitively visualized. Irregularity in the right A2 segment,
likely multifocal stenosis (series 100, image 106). Anterior
cerebral arteries are otherwise patent to their distal aspects.

No M1 stenosis or occlusion. Normal MCA bifurcations. Distal MCA
branches perfused, with multifocal irregularity in several right MCA
branches (series 100, images 95-108, 124-129, and 144-155).

Posterior circulation: Vertebral arteries patent to the
vertebrobasilar junction without stenosis. Basilar patent to its
distal aspect. Superior cerebellar arteries patent bilaterally.

Patent P1 segments. PCAs perfused to their distal aspects, with some
irregularity in the bilateral distal P2 and P3 segments (series 100,
image 87). The bilateral posterior communicating arteries are not
visualized.

Anatomic variants: None significant

MRA NECK FINDINGS

Aortic arch: Normal aortic branching. No evidence of dissection or
aneurysm.

Right carotid system: No evidence of dissection, occlusion, or
hemodynamically significant stenosis (greater than 50%).

Left carotid system: No evidence of dissection, occlusion, or
hemodynamically significant stenosis (greater than 50%).

Vertebral arteries: The origins of the vertebral arteries are not
well seen due to artifact. The remainder of the vertebral arteries
appear patent, without significant stenosis, dissection, or
occlusion.

Other: None
IMPRESSION: 1. Acute infarct in the right thalamus, with additional subacute to
late subacute infarcts in the bilateral corona radiata. Given the
patient's young age in multiple vascular territories, an embolic
etiology is suspected.
2. Multifocal irregularity in the right A2, right MCA branches, and
bilateral P2 segments, which may indicate multifocal narrowing but
could also be artifactual.
3. No hemodynamically significant stenosis in the neck.

with provider [HOSPITAL] VIK.

## 2021-04-03 MED ORDER — INSULIN ASPART 100 UNIT/ML IJ SOLN
0.0000 [IU] | Freq: Three times a day (TID) | INTRAMUSCULAR | Status: DC
  Administered 2021-04-04: 3 [IU] via SUBCUTANEOUS
  Administered 2021-04-04: 5 [IU] via SUBCUTANEOUS
  Administered 2021-04-04 – 2021-04-05 (×2): 8 [IU] via SUBCUTANEOUS
  Administered 2021-04-05: 3 [IU] via SUBCUTANEOUS
  Administered 2021-04-05: 5 [IU] via SUBCUTANEOUS
  Administered 2021-04-06 (×2): 2 [IU] via SUBCUTANEOUS
  Administered 2021-04-06: 3 [IU] via SUBCUTANEOUS

## 2021-04-03 MED ORDER — ACETAMINOPHEN 160 MG/5ML PO SOLN: 650.0000 mg | ORAL | Status: DC | PRN

## 2021-04-03 MED ORDER — POTASSIUM CHLORIDE 20 MEQ PO PACK
20.0000 meq | PACK | Freq: Once | ORAL | Status: AC
  Administered 2021-04-04: 20 meq via ORAL
  Filled 2021-04-03: qty 1

## 2021-04-03 MED ORDER — GADOBUTROL 1 MMOL/ML IV SOLN
10.0000 mL | Freq: Once | INTRAVENOUS | Status: AC | PRN
  Administered 2021-04-03: 10 mL via INTRAVENOUS

## 2021-04-03 MED ORDER — SENNOSIDES-DOCUSATE SODIUM 8.6-50 MG PO TABS: 1.0000 | ORAL_TABLET | Freq: Every evening | ORAL | Status: DC | PRN

## 2021-04-03 MED ORDER — PANTOPRAZOLE SODIUM 40 MG PO TBEC
40.0000 mg | DELAYED_RELEASE_TABLET | Freq: Every day | ORAL | Status: DC
  Administered 2021-04-04 – 2021-04-07 (×4): 40 mg via ORAL
  Filled 2021-04-03 (×4): qty 1

## 2021-04-03 MED ORDER — ACETAMINOPHEN 650 MG RE SUPP: 650.0000 mg | RECTAL | Status: DC | PRN

## 2021-04-03 MED ORDER — BUPROPION HCL ER (SR) 150 MG PO TB12
150.0000 mg | ORAL_TABLET | Freq: Two times a day (BID) | ORAL | Status: DC
  Administered 2021-04-03 – 2021-04-07 (×8): 150 mg via ORAL
  Filled 2021-04-03 (×9): qty 1

## 2021-04-03 MED ORDER — LORAZEPAM 2 MG/ML IJ SOLN: 0.5000 mg | Freq: Once | INTRAMUSCULAR | Status: DC

## 2021-04-03 MED ORDER — GABAPENTIN 600 MG PO TABS
600.0000 mg | ORAL_TABLET | Freq: Three times a day (TID) | ORAL | Status: DC
  Administered 2021-04-03 – 2021-04-07 (×11): 600 mg via ORAL
  Filled 2021-04-03 (×11): qty 1

## 2021-04-03 MED ORDER — ACETAMINOPHEN 325 MG PO TABS: 650.0000 mg | ORAL_TABLET | ORAL | Status: DC | PRN

## 2021-04-03 MED ORDER — NICOTINE 21 MG/24HR TD PT24
21.0000 mg | MEDICATED_PATCH | Freq: Every day | TRANSDERMAL | Status: DC
  Administered 2021-04-03 – 2021-04-06 (×4): 21 mg via TRANSDERMAL
  Filled 2021-04-03 (×4): qty 1

## 2021-04-03 MED ORDER — STROKE: EARLY STAGES OF RECOVERY BOOK
Freq: Once | Status: AC
  Administered 2021-04-03: 1
  Filled 2021-04-03: qty 1

## 2021-04-03 MED ORDER — INSULIN ASPART 100 UNIT/ML IJ SOLN
0.0000 [IU] | Freq: Every day | INTRAMUSCULAR | Status: DC
  Administered 2021-04-04: 2 [IU] via SUBCUTANEOUS

## 2021-04-03 MED ORDER — HEPARIN SODIUM (PORCINE) 5000 UNIT/ML IJ SOLN
5000.0000 [IU] | Freq: Three times a day (TID) | INTRAMUSCULAR | Status: DC
  Administered 2021-04-03 – 2021-04-07 (×11): 5000 [IU] via SUBCUTANEOUS
  Filled 2021-04-03 (×11): qty 1

## 2021-04-03 MED ORDER — INSULIN DETEMIR 100 UNIT/ML ~~LOC~~ SOLN
30.0000 [IU] | Freq: Every day | SUBCUTANEOUS | Status: DC
  Administered 2021-04-04: 30 [IU] via SUBCUTANEOUS
  Filled 2021-04-03 (×3): qty 0.3

## 2021-04-03 NOTE — ED Triage Notes (Signed)
Pt ambulatory to triage for hypertension.  Reports was seen at [facility] ED yesterday for same.  Pt reports that he declined admission yesterday.   Reports numbness to L side of head and face.  Resp even and unlabored.  Skin warm and dry.  nad ?

## 2021-04-03 NOTE — Assessment & Plan Note (Addendum)
Hemoglobin A1c 11.0, poorly controlled.  Home regimen includes Lantus 42 units subcutaneously daily, glipizide 10 mg p.o. twice daily, Victoza 2.1 mg [place] daily, metformin 1000 mg p.o. twice daily.  Outpatient follow-up with PCP.  Consistent carb regular diet. ?

## 2021-04-03 NOTE — Assessment & Plan Note (Addendum)
Repleted during hospitalization. °

## 2021-04-03 NOTE — ED Notes (Signed)
Patient transported to MRI 

## 2021-04-03 NOTE — Plan of Care (Signed)
?  Problem: Education: ?Goal: Knowledge of General Education information will improve ?Description: Including pain rating scale, medication(s)/side effects and non-pharmacologic comfort measures ?Outcome: Progressing ?  ?Problem: Health Behavior/Discharge Planning: ?Goal: Ability to manage health-related needs will improve ?Outcome: Progressing ?  ?Problem: Clinical Measurements: ?Goal: Ability to maintain clinical measurements within normal limits will improve ?Outcome: Progressing ?Goal: Will remain free from infection ?Outcome: Progressing ?Goal: Diagnostic test results will improve ?Outcome: Progressing ?Goal: Respiratory complications will improve ?Outcome: Progressing ?Goal: Cardiovascular complication will be avoided ?Outcome: Progressing ?  ?Problem: Activity: ?Goal: Risk for activity intolerance will decrease ?Outcome: Progressing ?  ?Problem: Nutrition: ?Goal: Adequate nutrition will be maintained ?Outcome: Progressing ?  ?Problem: Coping: ?Goal: Level of anxiety will decrease ?Outcome: Progressing ?  ?Problem: Elimination: ?Goal: Will not experience complications related to bowel motility ?Outcome: Progressing ?Goal: Will not experience complications related to urinary retention ?Outcome: Progressing ?  ?Problem: Pain Managment: ?Goal: General experience of comfort will improve ?Outcome: Progressing ?  ?Problem: Safety: ?Goal: Ability to remain free from injury will improve ?Outcome: Progressing ?  ?Problem: Skin Integrity: ?Goal: Risk for impaired skin integrity will decrease ?Outcome: Progressing ?  ?Problem: Education: ?Goal: Knowledge of disease or condition will improve ?Outcome: Progressing ?Goal: Knowledge of secondary prevention will improve (SELECT ALL) ?Outcome: Progressing ?Goal: Knowledge of patient specific risk factors will improve (INDIVIDUALIZE FOR PATIENT) ?Outcome: Progressing ?  ?Problem: Coping: ?Goal: Will verbalize positive feelings about self ?Outcome: Progressing ?Goal: Will  identify appropriate support needs ?Outcome: Progressing ?  ?

## 2021-04-03 NOTE — Consult Note (Signed)
NEUROLOGY CONSULTATION NOTE  ? ?Date of service: April 03, 2021 ?Patient Name: Dylan Ross ?MRN:  [id number] ?DOB:  Mar 28, 1975 ?Reason for consult: "strokes on MRI" ?Requesting Provider: Rolla Plate, DO ?_ _ _   _ __   _ __ _ _  __ __   _ __   __ _ ? ?History of Present Illness  ?Dylan Ross is a 46 y.o. male with PMH significant for DM2, HTN, nephrolithiasis, tobacco use who presents with L facial and arm numbness. ? ?Woke up at 0200 on Wednesday and an hour later, had L tongue, cheek and jaw numbness which spread to his L arm. Went to local hospital but then left and came to [facility] ED. 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 ? ?Denies any prior hx of stroke, extensive family hx of stroke. Endorses hx of DM2, HTN, smokes about 1 ppd, occasional marijuana use. ? ?LKW: 04/02/21 ?mRS: 0 ?tNKASE: not offered, outside window ?Thrombectomy: not offered, outside window. ?NIHSS components Score: Comment  ?1a Level of Conscious 0[x]  1[]  2[]  3[]      ?1b LOC Questions 0[x]  1[]  2[]       ?1c LOC Commands 0[x]  1[]  2[]       ?2 Best Gaze 0[x]  1[]  2[]       ?3 Visual 0[x]  1[]  2[]  3[]      ?4 Facial Palsy 0[x]  1[]  2[]  3[]      ?5a Motor Arm - left 0[x]  1[]  2[]  3[]  4[]  UN[]    ?5b Motor Arm - Right 0[x]  1[]  2[]  3[]  4[]  UN[]    ?6a Motor Leg - Left 0[x]  1[]  2[]  3[]  4[]  UN[]    ?6b Motor Leg - Right 0[x]  1[]  2[]  3[]  4[]  UN[]    ?7 Limb Ataxia 0[x]  1[]  2[]  3[]  UN[]     ?8 Sensory 0[]  1[x]  2[]  UN[]      ?9 Best Language 0[x]  1[]  2[]  3[]      ?10 Dysarthria 0[x]  1[]  2[]  UN[]      ?11 Extinct. and Inattention 0[x]  1[]  2[]       ?TOTAL: 1   ? ? ?  ?ROS  ? ?Constitutional Denies weight loss, fever and chills.   ?HEENT Denies changes in vision and hearing.   ?Respiratory Denies SOB and cough.   ?CV Denies palpitations and CP   ?GI Denies abdominal pain, nausea, vomiting and diarrhea.   ?GU Denies dysuria and urinary frequency.    ?MSK Denies myalgia and joint pain.   ?Skin Denies rash and pruritus.   ?Neurological Denies headache and syncope.   ?Psychiatric Denies recent changes in mood. Denies anxiety and depression.   ? ?Past History  ? ?Past Medical History:  ?Diagnosis Date  ? Diabetes mellitus without complication ([place])   ? Diverticulosis   ? Hypertension   ? Kidney stones   ? ?Past Surgical History:  ?Procedure Laterality Date  ? right inguinal hernia repair    ? ?Family History  ?Problem Relation Age of Onset  ? Stroke Mother   ? Cancer Mother   ? Colon polyps Mother   ?     age greater than 39  ? Cirrhosis Father   ? Colon cancer Neg Hx   ? ?Social History  ? ?Socioeconomic History  ? Marital status: Legally Separated  ?  Spouse name: Not on file  ? Number of children: Not on file  ? Years of education: Not on file  ? Highest education level:  Not on file  ?Occupational History  ? Occupation: unemployed  ?  Comment: looking for job   ?Tobacco Use  ? Smoking status: Every Day  ?  Types: Cigarettes  ? Smokeless tobacco: Never  ? Tobacco comments:  ?  0.5 to a whole pack a day.   ?Vaping Use  ? Vaping Use: Never used  ?Substance and Sexual Activity  ? Alcohol use: Yes  ?  Comment: twice a week 12-24 ounces  ? Drug use: Yes  ?  Types: Marijuana  ?  Comment: occasionally  ? Sexual activity: Yes  ?Other Topics Concern  ? Not on file  ?Social History Narrative  ? Not on file  ? ?Social Determinants of Health  ? ?Financial Resource Strain: Not on file  ?Food Insecurity: Not on file  ?Transportation Needs: Not on file  ?Physical Activity: Not on file  ?Stress: Not on file  ?Social Connections: Not on file  ? ?No Known Allergies ? ?Medications  ? ?Medications Prior to Admission  ?Medication Sig Dispense Refill Last Dose  ? AMLODIPINE BESYLATE PO Take 10 mg by mouth daily.   04/03/2021  ? atenolol (TENORMIN) 100 MG tablet Take 100 mg by mouth daily.   04/03/2021  ? buPROPion (ZYBAN) 150 MG 12 hr tablet Take 150 mg by mouth 2 (two) times daily.    04/03/2021  ? gabapentin (NEURONTIN) 600 MG tablet Take 600 mg by mouth 3 (three) times daily.   Past Month  ? glipiZIDE (GLUCOTROL) 10 MG tablet Take 10 mg by mouth 2 (two) times daily before a meal.    04/03/2021  ? hydrochlorothiazide (MICROZIDE) 12.5 MG capsule Take 25 mg by mouth daily.    04/03/2021  ? liraglutide (VICTOZA) 18 MG/3ML SOPN Inject 2.1 mg into the skin daily.    04/02/2021  ? lisinopril (ZESTRIL) 40 MG tablet Take 40 mg by mouth daily.   0 04/03/2021  ? metFORMIN (GLUCOPHAGE) 1000 MG tablet Take 1 tablet (1,000 mg total) by mouth 2 (two) times daily. 60 tablet 3 04/03/2021  ? pantoprazole (PROTONIX) 40 MG tablet Take 1 tablet (40 mg total) by mouth daily. Take 30 min before dinner 30 tablet 3 04/03/2021  ? spironolactone (ALDACTONE) 50 MG tablet Take 50 mg by mouth daily.   04/03/2021  ?  ? ?Vitals  ? ?Vitals:  ? 04/03/21 1640 04/03/21 1810 04/03/21 1955 04/03/21 2145  ?BP:  123/84 (!) 134/92 (!) 130/98  ?Pulse: 77 76 77 81  ?Resp: (!) 24  14 16   ?Temp:   97.8 ?F (36.6 ?C) 99.2 ?F (37.3 ?C)  ?TempSrc:   Oral Oral  ?SpO2: 96% 97% 95% 98%  ?Weight:      ?Height:      ?  ? ?Body mass index is 36.96 kg/m?. ? ?Physical Exam  ? ?General: Laying comfortably in bed; in no acute distress.  ?HENT: Normal oropharynx and mucosa. Normal external appearance of ears and nose.  ?Neck: Supple, no pain or tenderness  ?CV: No JVD. No peripheral edema.  ?Pulmonary: Symmetric Chest rise. Normal respiratory effort.  ?Abdomen: Soft to touch, non-tender.  ?Ext: No cyanosis, edema, or deformity  ?Skin: No rash. Normal palpation of skin.   ?Musculoskeletal: Normal digits and nails by inspection. No clubbing.  ? ?Neurologic Examination  ?Mental status/Cognition: Alert, oriented to self, place, month and year, good attention.  ?Speech/language: Fluent, comprehension intact, object naming intact, repetition intact.  ?Cranial nerves:  ? CN II Pupils equal and reactive to light, no VF  deficits   ? CN III,IV,VI EOM intact, no gaze  preference or deviation, no nystagmus   ? CN V normal sensation in V1, V2, and V3 segments bilaterally   ? CN VII no asymmetry, no nasolabial fold flattening   ? CN VIII normal hearing to speech   ? CN IX & X normal palatal elevation, no uvular deviation   ? CN XI 5/5 head turn and 5/5 shoulder shrug bilaterally   ? CN XII midline tongue protrusion   ? ?Motor:  ?Muscle bulk: normal, tone normal, pronator drift none tremor none ?Mvmt Root Nerve  Muscle Right Left Comments  ?SA C5/6 Ax Deltoid 5 5   ?EF C5/6 Mc Biceps 5 5   ?EE C6/7/8 Rad Triceps 5 5   ?WF C6/7 Med FCR     ?WE C7/8 PIN ECU     ?F Ab C8/T1 U ADM/FDI 5 5   ?HF L1/2/3 Fem Illopsoas 5 5   ?KE L2/3/4 Fem Quad 5 5   ?DF L4/5 D Peron Tib Ant 5 5   ?PF S1/2 Tibial Grc/Sol 5 5   ? ?Reflexes: ? Right Left Comments  ?Pectoralis     ? Biceps (C5/6) 1 1   ?Brachioradialis (C5/6) 1 1   ? Triceps (C6/7) 1 1   ? Patellar (L3/4) 1 1   ? Achilles (S1)     ? Hoffman     ? Plantar     ?Jaw jerk   ? ?Sensation: ? Light touch Decreased in left lower face and patchy sensory loss in L forearm.  ? Pin prick   ? Temperature   ? Vibration   ?Proprioception   ? ?Coordination/Complex Motor:  ?- Finger to Nose intact BL ?- Heel to shin intact BL ?- Rapid alternating movement are normal ?- Gait: Deferred. ? ?Labs  ? ?CBC:  ?Recent Labs  ?Lab 04/03/21 ?1538  ?WBC 10.5  ?NEUTROABS 6.6  ?HGB 16.6  ?HCT 46.0  ?MCV 90.9  ?PLT 308  ? ? ?Basic Metabolic Panel:  ?Lab Results  ?Component Value Date  ? NA 132 (L) 04/03/2021  ? K 3.4 (L) 04/03/2021  ? CO2 23 04/03/2021  ? GLUCOSE 190 (H) 04/03/2021  ? BUN 9 04/03/2021  ? CREATININE 0.88 04/03/2021  ? CALCIUM 9.0 04/03/2021  ? GFRNONAA >60 04/03/2021  ? GFRAA >60 02/16/2017  ? ?Lipid Panel: No results found for: [place] ?HgbA1c: No results found for: HGBA1C ?Urine Drug Screen:  ?   ?Component Value Date/Time  ? [place] DETECTED 02/16/2017 1757  ? [place] DETECTED 02/16/2017 1757  ? LABBENZ POSITIVE (A) 02/16/2017 1757  ?  AMPHETMU NONE DETECTED 02/16/2017 1757  ? THCU POSITIVE (A) 02/16/2017 1757  ? LABBARB NONE DETECTED 02/16/2017 1757  ?  ?Alcohol Level No results found for: [place] ? ?MR Angio head without contrast and MR angio n

## 2021-04-03 NOTE — ED Provider Notes (Signed)
?[facility] EMERGENCY DEPARTMENT ?Provider Note ? ?CSN: [id number] ?Arrival date & time: 04/03/21 1316 ? ?Chief Complaint(s) ?Hypertension ? ?HPI ?Dylan Ross is a 46 y.o. male with PMH T2DM, HTN who presents emergency department for evaluation of numbness.  Patient states that he was seen at an emergency department yesterday for systolic blood pressures greater than 200 and new onset left-sided facial numbness, neck numbness, tongue numbness, upper extremity numbness.  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  He states now all digits in the hand are involved.  He denies chest pain, shortness of breath, abdominal pain, nausea, vomiting or other systemic symptoms.  Patient states that yesterday in the emergency department his CT was negative and his blood pressure got under control and they wanted to admit him to the hospital, but he requested discharge at that time.  He now has things worked out with his work and returns the emergency department for evaluation. ? ?Hypertension ? ? ?Past Medical History ?Past Medical History:  ?Diagnosis Date  ? Diabetes mellitus without complication (HCC)   ? Diverticulosis   ? Hypertension   ? Kidney stones   ? ?Patient Active Problem List  ? Diagnosis Date Noted  ? GERD (gastroesophageal reflux disease) 05/26/2018  ? Rectal bleeding 03/18/2018  ? Frequent stools 03/18/2018  ? ?Home Medication(s) ?Prior to Admission medications   ?Medication Sig Start Date End Date Taking? Authorizing Provider  ?gabapentin (NEURONTIN) 400 MG capsule Take 400 mg by mouth 3 (three) times daily.     [provider]  ?glipiZIDE (GLUCOTROL) 10 MG tablet Take 10 mg by mouth 2 (two) times daily before a meal.     [provider]  ?hydrochlorothiazide (MICROZIDE) 12.5 MG capsule Take 25 mg by mouth daily.     [provider]  ?liraglutide (VICTOZA) 18 MG/3ML SOPN Inject 2.1  mg into the skin daily.     [provider]  ?lisinopril (ZESTRIL) 40 MG tablet Take 40 mg by mouth daily.  02/12/17   [provider]  ?metFORMIN (GLUCOPHAGE) 1000 MG tablet Take 1 tablet (1,000 mg total) by mouth 2 (two) times daily. 02/16/17   Mancel BaleWentz, Elliott, MD  ?pantoprazole (PROTONIX) 40 MG tablet Take 1 tablet (40 mg total) by mouth daily. Take 30 min before dinner 05/26/18   Letta MedianHarper, Kristen S, PA-C  ?                                                                                                                                  ?Past Surgical History ?Past Surgical History:  ?Procedure Laterality Date  ? right inguinal hernia repair    ? ?Family History ?Family History  ?Problem Relation Age of Onset  ? Stroke Mother   ? Cancer Mother   ? Colon polyps Mother   ?  age greater than 51  ? Cirrhosis Father   ? Colon cancer Neg Hx   ? ? ?Social History ?Social History  ? ?Tobacco Use  ? Smoking status: Every Day  ?  Types: Cigarettes  ? Smokeless tobacco: Never  ? Tobacco comments:  ?  0.5 to a whole pack a day.   ?Vaping Use  ? Vaping Use: Never used  ?Substance Use Topics  ? Alcohol use: Yes  ?  Comment: twice a week 12-24 ounces  ? Drug use: Yes  ?  Types: Marijuana  ?  Comment: occasionally  ? ?Allergies ?Patient has no known allergies. ? ?Review of Systems ?Review of Systems  ?Neurological:  Positive for numbness.  ? ?Physical Exam ?Vital Signs  ?I have reviewed the triage vital signs ?BP (!) 139/96 (BP Location: Right Arm)   Pulse 84   Temp 97.9 ?F (36.6 ?C) (Oral)   Resp 20   Ht 5\' 11"  (1.803 m)   Wt 120.2 kg   SpO2 95%   BMI 36.96 kg/m?  ? ?Physical Exam ?Vitals and nursing note reviewed.  ?Constitutional:   ?   General: He is not in acute distress. ?   Appearance: He is well-developed.  ?HENT:  ?   Head: Normocephalic and atraumatic.  ?Eyes:  ?   Conjunctiva/sclera: Conjunctivae normal.  ?Cardiovascular:  ?   Rate and Rhythm: Normal rate and regular rhythm.  ?   Heart sounds: No  murmur heard. ?Pulmonary:  ?   Effort: Pulmonary effort is normal. No respiratory distress.  ?   Breath sounds: Normal breath sounds.  ?Abdominal:  ?   Palpations: Abdomen is soft.  ?   Tenderness: There is no abdominal tenderness.  ?Musculoskeletal:     ?   General: No swelling.  ?   Cervical back: Neck supple.  ?Skin: ?   General: Skin is warm and dry.  ?   Capillary Refill: Capillary refill takes less than 2 seconds.  ?Neurological:  ?   Mental Status: He is alert.  ?   Cranial Nerves: No cranial nerve deficit.  ?   Sensory: Sensory deficit (Left face V1 through V3, left upper extremity) present.  ?   Motor: No weakness.  ?Psychiatric:     ?   Mood and Affect: Mood normal.  ? ? ?ED Results and Treatments ?Labs ?(all labs ordered are listed, but only abnormal results are displayed) ?Labs Reviewed - No data to display                                                                                                                       ? ?Radiology ?No results found. ? ?Pertinent labs & imaging results that were available during my care of the patient were reviewed by me and considered in my medical decision making (see MDM for details). ? ?Medications Ordered in ED ?Medications - No data to display                                                               ?                                                                    ?  Procedures ?Procedures ? ?(including critical care time) ? ?Medical Decision Making / ED Course ? ? ?This patient presents to the ED for concern of numbness, this involves an extensive number of treatment options, and is a complaint that carries with it a high risk of complications and morbidity.  The differential diagnosis includes CVA, TIA, cervical compression, neuropathy ? ?MDM: ?Patient seen emergency department for evaluation of facial and arm numbness.  Physical exam with lack of two-point discrimination over V1 through V3, left neck and shoulder, left upper extremity.  No significant  weakness.  Laboratory evaluation with mild hypokalemia to 3.4, AST 103, ALT 87 likely secondary to patient's continued alcohol use.  With negative CT yesterday, and high suspicion for stroke, patient received an MRI brain and MRA brain and neck that shows an acute infarct in the right thalamus with additional subacute to late subacute infarcts in bilateral corona radiata as well as multifocal irregularity and right A2, right MCA branches, bilateral P2 segments.  Neurology was consulted and Dr. Amada Jupiter recommending admission to [facility] for embolic stroke work-up.  Patient then admitted to [facility]. ? ? ?Additional history obtained: ? ?-External records from outside source obtained and reviewed including: Chart review including previous notes, labs, imaging, consultation notes ? ? ?Lab Tests: ?-I ordered, reviewed, and interpreted labs.   ?The pertinent results include:   ?Labs Reviewed - No data to display  ? ? ?EKG  ? EKG Interpretation ? ?Date/Time:  Thursday April 03 2021 15:28:38 EDT ?Ventricular Rate:  78 ?PR Interval:  174 ?QRS Duration: 99 ?QT Interval:  429 ?QTC Calculation: 489 ?R Axis:   69 ?Text Interpretation: Sinus rhythm Borderline prolonged QT interval Confirmed by Hollynn Garno [phone]) on 04/03/2021 4:09:57 PM ?  ? ?  ? ? ? ?Imaging Studies ordered: ?I ordered imaging studies including MRI brain, MRA ?I independently visualized and interpreted imaging. ?I agree with the radiologist interpretation ? ? ?Medicines ordered and prescription drug management: ?No orders of the defined types were placed in this encounter. ?  ?-I have reviewed the patients home medicines and have made adjustments as needed ? ?Critical interventions ?none ? ?Consultations Obtained: ?I requested consultation with the neurologist,  and discussed lab and imaging findings as well as pertinent plan - they recommend: Admission to [facility] ? ? ?Cardiac Monitoring: ?The patient was maintained on a cardiac monitor.  I  personally viewed and interpreted the cardiac monitored which showed an underlying rhythm of: Normal sinus rhythm ? ?Social Determinants of Health:  ?Factors impacting patients care include: Persistent alcohol use ? ?

## 2021-04-03 NOTE — Assessment & Plan Note (Addendum)
Home medication regimen includes amlodipine 10 mg p.o. daily, atenolol 100 mg p.o. daily, hydrochlorothiazide 25 mg p.o. daily, lisinopril 40 mg p.o. daily, spironolactone 50 mg p.o. daily. ?

## 2021-04-03 NOTE — Assessment & Plan Note (Addendum)
Patient presenting with left-sided facial numbness, paresthesias left upper extremity.  Onset 3 AM on 04/02/2021; initially seen at [facility] but left after admission was recommended.  MR head with acute infarct right thalamus and subacute infarcts of the bilateral corona radiata; concerning for possible embolic etiology.  LDL 80, triglycerides 300.  Hemoglobin A1c 11.0.  TTE with LVEF 60 to 65%, no regional wall motion normalities, trivial MR, IVC normal in size, cannot exclude a small PFO.  Neurology was consulted and followed during hospital course.  Seen by PT/OT/SLP with no needs identified.  TEE with no intracardiac source of stroke, no PFO or intra-atrial shunt.  We will continue dual antiplatelet therapy with aspirin 81 mg p.o. daily, Plavix 75 mg p.o. daily for 3 weeks followed by aspirin alone.  Started on Crestor 20 mg p.o. daily.  Continue tobacco and alcohol cessation. ?

## 2021-04-03 NOTE — ED Notes (Signed)
Attempted to call report, unsuccessful.  Left name and number. ?

## 2021-04-03 NOTE — Assessment & Plan Note (Addendum)
Patient continues to endorse 1 pack/day.  Counseled on need for complete cessation. Bupropion 150 mg p.o. twice daily, nicotine patch, nicotine gum. ?

## 2021-04-03 NOTE — Assessment & Plan Note (Addendum)
Patient denies any further reflux symptoms, will discontinue Protonix at this time. ?

## 2021-04-03 NOTE — ED Notes (Signed)
Attempted to call report, unsuccessful

## 2021-04-03 NOTE — H&P (Signed)
?History and Physical  ? ? ?Patient: Dylan Ross [id number] DOB: May 15, 1975 ?DOA: 04/03/2021 ?DOS: the patient was seen and examined on 04/03/2021 ?PCP: [facility]  ?Patient coming from: Home ? ?Chief Complaint:  ?Chief Complaint  ?Patient presents with  ? Hypertension  ? ?HPI: Dylan Ross is a 46 y.o. male with medical history significant of with history of GERD, hypertension, diabetes mellitus type 2, tobacco use disorder, presents to ED with a chief complaint of left face numbness.  Patient reports onset of symptoms was 3 AM on Wednesday.  Last known normal was about 1 AM on Wednesday.  He first noticed the numbness in his tongue, and then moved to his gums, then his lips, then his cheek, now is his whole left side of his face, across his left neck, include his left upper extremity.  He notes that the numbness on his left upper extremity include his dorsal aspect of his hand but not the palm of his hand.  He also notes intermittent paresthesias in the left upper extremity.  Patient notes that when the symptoms first started occurring his blood pressure was 280/144.  Patient reports that he is mostly compliant with his medications for blood pressure, but occasionally skips them for up to week at a time.  He notes that he has been taking them for the last 2 days.  Patient has not had a headache since the onset of symptoms.  He has not had any difficulty with eating or drinking.  He has had no vision changes, no slurred speech.  He does not have actual weakness, just numbness in the left upper and lower extremities.  Patient reports the symptoms remain constant at admission. ? ?Patient currently smokes a pack a day.  He drinks alcohol about a sixpack 2 times per week.  He uses marijuana and the last use was the night of the 28th. ? ?Of note patient reports he went to [facility] in the morning of the 29th.  They requested admission, but he declined as they did not have an answer for his  symptoms per his report. ?Review of Systems: As mentioned in the history of present illness. All other systems reviewed and are negative. ?Past Medical History:  ?Diagnosis Date  ? Diabetes mellitus without complication ([place])   ? Diverticulosis   ? Hypertension   ? Kidney stones   ? ?Past Surgical History:  ?Procedure Laterality Date  ? right inguinal hernia repair    ? ?Social History:  reports that he has been smoking cigarettes. He has never used smokeless tobacco. He reports current alcohol use. He reports current drug use. Drug: Marijuana. ? ?No Known Allergies ? ?Family History  ?Problem Relation Age of Onset  ? Stroke Mother   ? Cancer Mother   ? Colon polyps Mother   ?     age greater than 82  ? Cirrhosis Father   ? Colon cancer Neg Hx   ? ? ?Prior to Admission medications   ?Medication Sig Start Date End Date Taking? Authorizing Provider  ?AMLODIPINE BESYLATE PO Take 10 mg by mouth daily.   Yes [provider]  ?atenolol (TENORMIN) 100 MG tablet Take 100 mg by mouth daily.   Yes [provider]  ?buPROPion (ZYBAN) 150 MG 12 hr tablet Take 150 mg by mouth 2 (two) times daily.   Yes [provider]  ?gabapentin (NEURONTIN) 600 MG tablet Take 600 mg by mouth 3 (three) times daily. 12/20/20  Yes [provider]  ?glipiZIDE (GLUCOTROL) 10 MG tablet Take 10 mg by mouth 2 (two) times daily before a meal.    Yes [provider]  ?hydrochlorothiazide (MICROZIDE) 12.5 MG capsule Take 25 mg by mouth daily.    Yes [provider]  ?liraglutide (VICTOZA) 18 MG/3ML SOPN Inject 2.1 mg into the skin daily.    Yes [provider]  ?lisinopril (ZESTRIL) 40 MG tablet Take 40 mg by mouth daily.  02/12/17  Yes [provider]  ?metFORMIN (GLUCOPHAGE) 1000 MG tablet Take 1 tablet (1,000 mg total) by mouth 2 (two) times daily. 02/16/17  Yes Daleen Bo, MD  ?pantoprazole (PROTONIX) 40 MG tablet Take 1 tablet (40 mg total) by mouth daily. Take 30 min before  dinner 05/26/18  Yes Jodi Mourning, Kristen S, PA-C  ?spironolactone (ALDACTONE) 50 MG tablet Take 50 mg by mouth daily.   Yes [provider]  ? ? ?Physical Exam: ?Vitals:  ? 04/03/21 1640 04/03/21 1810 04/03/21 1955 04/03/21 2145  ?BP:  123/84 (!) 134/92 (!) 130/98  ?Pulse: 77 76 77 81  ?Resp: (!) 24  14 16   ?Temp:   97.8 ?F (36.6 ?C) 99.2 ?F (37.3 ?C)  ?TempSrc:   Oral Oral  ?SpO2: 96% 97% 95% 98%  ?Weight:      ?Height:      ? ?1.  General: ?Patient lying supine in bed,  no acute distress ?  ?2. Psychiatric: ?Alert and oriented x 3, mood and behavior normal for situation, pleasant and cooperative with exam ?  ?3. Neurologic: ?Speech and language are normal, minimal left facial droop, moves all 4 extremities voluntarily, equal strength in the upper and lower extremities bilaterally, decreased sensation over left forehead, left cheek, left upper lip, and left mandible, hypersensitive (feeling like paresthesias) on palpation of the left upper extremity, equal sensation in the left lower extremity when compared to the right.   ?  ?4. HEENMT:  ?Head is atraumatic, normocephalic, pupils reactive to light, neck is supple, trachea is midline, mucous membranes are moist ?  ?5. Respiratory : ?Lungs are clear to auscultation bilaterally without wheezing, rhonchi, rales, no cyanosis, no increase in work of breathing or accessory muscle use ?  ?6. Cardiovascular : ?Heart rate normal, rhythm is regular, no murmurs, rubs or gallops, no peripheral edema, peripheral pulses palpated ?  ?7. Gastrointestinal:  ?Abdomen is soft, nondistended, nontender to palpation bowel sounds active, no masses or organomegaly palpated ?  ?8. Skin:  ?Skin is warm, dry and intact without rashes, acute lesions, or ulcers on limited exam ?  ?9.Musculoskeletal:  ?No acute deformities or trauma, no asymmetry in tone, no peripheral edema, peripheral pulses palpated, no tenderness to palpation in the extremities ? ?Data Reviewed: ?In the ED ?Temp 99.2,  heart rate 76-84, respiratory 14-24, blood pressure 130/98, satting at 98% ?No leukocytosis with white blood cell count of 10.5, hemoglobin 16.6, platelets 308 ?Chemistry panel reveals a slight hypokalemia at 3.4 slight hypochloremia at 97, and a hyperglycemia at 190 ?MRA of the head and neck showed acute infarct of the right thalamus subacute infarcts in the bilateral corona radiata that are likely an embolic etiology.  No hemodynamically significant stenosis within the neck ?EKG shows a heart rate 78, sinus rhythm, QTc 489 ?Admission requested for CVA work-up ? ? ?Assessment and Plan: ?* CVA (cerebral vascular accident) ([place]) ?Symptoms started at 3 AM on 29 March ?Left-side facial numbness, paresthesias in left upper extremity and left lower extremity ?MRA shows acute infarct of  the right thalamus and subacute infarcts of the bilateral corona radiata ?No hemodynamically significant stenosis in the neck ?Per ED provider, neuro recommends admission to [facility] ?Ultrasound carotids, echo in the a.m. ?PT/OT/ST eval and treat ?Modified stroke scale every 4 hours ?RN to notify neuro hospitalist upon patient's arrival to [facility] ? ?Hypokalemia ?Potassium 3.4 ?20 mEq of potassium replaced ?Recheck with CMP in the a.m. ? ?Tobacco use disorder ?Patient reports that he is already feeling nicotine withdrawal. ?Start nicotine patch ?Counseled on importance of cessation ?Patient willing to try to quit smoking again ? ?DMII (diabetes mellitus, type 2) ([place]) ?Patient reports that his last A1c was 12 ?Update hemoglobin A1c ?Patient takes 42 units of basal insulin at home ?Continue reduced dose 30 units nightly while in hospital ?Sliding scale coverage ?Carb modified/heart healthy diet ?Continue to monitor CBGs ? ?HTN (hypertension) ?Patient is on amlodipine, atenolol, hydrochlorothiazide, and lisinopril at home ?Holding these medications for permissive hypertension ?If needed will add on hydralazine as needed ?BP currently  controlled at 0000000 systolic during admission ? ?GERD (gastroesophageal reflux disease) ?Continue PPI ? ? ? ? ? Advance Care Planning:   Code Status: Full Code full ? ?Consults: Neuro ? ?Family Communication:

## 2021-04-04 ENCOUNTER — Observation Stay (HOSPITAL_BASED_OUTPATIENT_CLINIC_OR_DEPARTMENT_OTHER): Payer: Medicaid Other

## 2021-04-04 DIAGNOSIS — I10 Essential (primary) hypertension: Secondary | ICD-10-CM | POA: Diagnosis not present

## 2021-04-04 DIAGNOSIS — E1165 Type 2 diabetes mellitus with hyperglycemia: Secondary | ICD-10-CM

## 2021-04-04 DIAGNOSIS — F17200 Nicotine dependence, unspecified, uncomplicated: Secondary | ICD-10-CM

## 2021-04-04 DIAGNOSIS — E876 Hypokalemia: Secondary | ICD-10-CM

## 2021-04-04 DIAGNOSIS — I639 Cerebral infarction, unspecified: Secondary | ICD-10-CM

## 2021-04-04 DIAGNOSIS — E781 Pure hyperglyceridemia: Secondary | ICD-10-CM | POA: Diagnosis not present

## 2021-04-04 DIAGNOSIS — E669 Obesity, unspecified: Secondary | ICD-10-CM

## 2021-04-04 DIAGNOSIS — K219 Gastro-esophageal reflux disease without esophagitis: Secondary | ICD-10-CM

## 2021-04-04 DIAGNOSIS — Z794 Long term (current) use of insulin: Secondary | ICD-10-CM

## 2021-04-04 DIAGNOSIS — I6389 Other cerebral infarction: Secondary | ICD-10-CM | POA: Diagnosis not present

## 2021-04-04 LAB — ECHOCARDIOGRAM COMPLETE
Area-P 1/2: 4.17 cm2
Calc EF: 58.5 %
Height: 71 in
S' Lateral: 2.9 cm
Single Plane A2C EF: 60.2 %
Single Plane A4C EF: 57.2 %
Weight: 4239.99 oz

## 2021-04-04 LAB — GLUCOSE, CAPILLARY
Glucose-Capillary: 180 mg/dL — ABNORMAL HIGH (ref 70–99)
Glucose-Capillary: 266 mg/dL — ABNORMAL HIGH (ref 70–99)
Glucose-Capillary: 217 mg/dL — ABNORMAL HIGH (ref 70–99)
Glucose-Capillary: 202 mg/dL — ABNORMAL HIGH (ref 70–99)

## 2021-04-04 LAB — LIPID PANEL
Cholesterol: 166 mg/dL (ref 0–200)
HDL: 26 mg/dL — ABNORMAL LOW (ref >40)
LDL Cholesterol: 80 mg/dL (ref 0–99)
Total CHOL/HDL Ratio: 6.4 RATIO
Triglycerides: 300 mg/dL — ABNORMAL HIGH (ref <150)
VLDL: 60 mg/dL — ABNORMAL HIGH (ref 0–40)

## 2021-04-04 LAB — SEDIMENTATION RATE: Sed Rate: 17 mm/hr — ABNORMAL HIGH (ref 0–16)

## 2021-04-04 LAB — HEMOGLOBIN A1C
Hgb A1c MFr Bld: 11 % — ABNORMAL HIGH (ref 4.8–5.6)
Mean Plasma Glucose: 269 mg/dL

## 2021-04-04 LAB — C-REACTIVE PROTEIN: CRP: 1.5 mg/dL — ABNORMAL HIGH (ref <1.0)

## 2021-04-04 LAB — ANTITHROMBIN III: AntiThromb III Func: 92 % (ref 75–120)

## 2021-04-04 LAB — HIV ANTIBODY (ROUTINE TESTING W REFLEX): HIV Screen 4th Generation wRfx: NONREACTIVE

## 2021-04-04 MED ORDER — ASPIRIN EC 81 MG PO TBEC
81.0000 mg | DELAYED_RELEASE_TABLET | Freq: Every day | ORAL | Status: DC
  Administered 2021-04-04 – 2021-04-07 (×4): 81 mg via ORAL
  Filled 2021-04-04 (×4): qty 1

## 2021-04-04 MED ORDER — ROSUVASTATIN CALCIUM 20 MG PO TABS
20.0000 mg | ORAL_TABLET | Freq: Every day | ORAL | Status: DC
  Administered 2021-04-04 – 2021-04-06 (×3): 20 mg via ORAL
  Filled 2021-04-04 (×3): qty 1

## 2021-04-04 MED ORDER — NICOTINE POLACRILEX 2 MG MT GUM
2.0000 mg | CHEWING_GUM | OROMUCOSAL | Status: DC | PRN
  Filled 2021-04-04: qty 1

## 2021-04-04 MED ORDER — CLOPIDOGREL BISULFATE 75 MG PO TABS
75.0000 mg | ORAL_TABLET | Freq: Every day | ORAL | Status: DC
  Administered 2021-04-04 – 2021-04-07 (×4): 75 mg via ORAL
  Filled 2021-04-04 (×4): qty 1

## 2021-04-04 MED ORDER — FENOFIBRATE 54 MG PO TABS
54.0000 mg | ORAL_TABLET | Freq: Every day | ORAL | Status: DC
  Administered 2021-04-04 – 2021-04-07 (×4): 54 mg via ORAL
  Filled 2021-04-04 (×5): qty 1

## 2021-04-04 NOTE — Progress Notes (Signed)
Inpatient Diabetes Program Recommendations ? ?AACE/ADA: New Consensus Statement on Inpatient Glycemic Control (2015) ? ?Target Ranges:  Prepandial:   less than 140 mg/dL ?     Peak postprandial:   less than 180 mg/dL (1-2 hours) ?     Critically ill patients:  140 - 180 mg/dL  ? ?Lab Results  ?Component Value Date  ? GLUCAP 266 (H) 04/04/2021  ? HGBA1C 11.0 (H) 04/04/2021  ? ? ?Review of Glycemic Control ? ?Diabetes history: type 2 ?Outpatient Diabetes medications: Lantus 42-45 units daily, Victoza 2.1 mg daily, Glucotrol 10 mg BID, Metformin 1000 mg BID ?Current orders for Inpatient glycemic control: Levemir 30 units at HS, Novolog MODERATE correction scale TID & HS scale ? ?Inpatient Diabetes Program Recommendations:   ?Spoke with patient at the bedside about his diabetes. Was diagnosed about 2 years ago. Has been on insulin since then. States that he has a PCP at the [facility]. Discussed his HgbA1C of 11%. Patient states that it has been higher than 11% in the past. Was pleased to hear that it had come down. Discussed diet; patient states that he tries to eat healthy. He states that he needs to stop smoking and drinking. States that he is a single parent. He is able to get his medications without financial issues at this time. 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 He hopes to be hired full time soon.  ? ?Encouraged patient to check blood sugars every day. States that he does have a blood glucose meter and strips at home. Reminded him to take his insulin and other medications as prescribed. States that sometimes he does not take Lantus if his blood sugar is 125 mg/dl or less. Encouraged him to talk with his physician about this. He does not have low blood sugars.  ? ?Will continue to monitor blood sugars while in the hospital. ? ?Smith Mince RN BSN CDE ?Diabetes Coordinator ?Pager: [phone]  8am-5pm  ? ? ? ? ? ?

## 2021-04-04 NOTE — Progress Notes (Signed)
?PROGRESS NOTE ? ? ? ?Dylan Ross  KGY:[id number] DOB: November 06, 1975 DOA: 04/03/2021 ?PCP: [facility]  ? ? ?Brief Narrative:  ?Dylan Ross is a 46 year old male with past medical history significant for HTN, DM2, tobacco use disorder, GERD who initially presented to [facility], ED on 3/30 complaining of left facial numbness.  Onset of symptoms 3 AM on 04/02/21.  He first noticed numbness in his tongue, then moved to his gums, lips, cheek and now his left side of his face, left neck and left upper extremity.  He first noted symptom onset occurring with his blood pressure 280/144.  He reports mostly compliant with his home medications but occasionally skips them for about a week at a time; and endorses no medications for the last 2 days.  Patient denies headache, no difficulty eating/drinking, no visual changes, no slurred speech, no focal weakness.  He initially presented to hospital in [place] on 29 March, they requested admission but he declined as they did not have an answer for his symptoms per his report. ? ?Patient currently endorses smoking a pack a day, drinks alcohol about a sixpack 2 times per week, and intermittent uses of marijuana. ? ?In the ED, temperature 97.9 ?F, HR 84, RR 20, BP 139/96, SPO2 95% on room air.  Sodium 132, potassium 3.4, chloride 97, CO2 23, glucose 190, BUN 9, creatinine 0.88.  AST 103, ALT 87.  WBC 10.5, hemoglobin 16.6, platelets 308.  MR brain without contrast with acute infarct right thalamus, subacute to late subacute infarcts bilateral corona radiata concerning for embolic etiology.  MRA head/neck with multifocal irregularity right A2, right MCA branches, bilateral P2 segments which may indicate multifocal narrowing but also could be artifactual, no hemodynamically significant stenosis in the neck.  Neurology was consulted.  Patient was transferred to [facility] for further evaluation of acute stroke.  ? ? ?Assessment & Plan: ?  ?Assessment and  Plan: ?* CVA (cerebral vascular accident) (HCC) ?Patient presenting with left-sided facial numbness, paresthesias left upper extremity.  Onset 3 AM on 04/02/2021; initially seen at [facility] but left after admission was recommended.  MR head with acute infarct right thalamus and subacute infarcts of the bilateral corona radiata; concerning for possible embolic etiology.  LDL 80, triglycerides 300.  Hemoglobin A1c 11.0. ?--Neurology following, appreciate assistance ?--DAPT Aspirin 81 mg p.o. daily, Plavix 75 mg p.o. daily x 3 weeks; followed by aspirin alone ?--Holding initiation of statin for transaminitis ?--TTE: Pending ?--PT/OT/SLP evaluation ?--Continue to monitor on telemetry; currently in NSR; may need outpatient monitor ? ?HTN (hypertension) ?Home medication regimen includes amlodipine 10 mg p.o. daily, atenolol 100 mg p.o. daily, hydrochlorothiazide 25 mg p.o. daily, lisinopril 40 mg p.o. daily, spironolactone 50 mg p.o. daily. ?--Holding home and hypertensives to allow permissive hypertension in the setting of acute CVA to 220/120 ? ?DMII (diabetes mellitus, type 2) (HCC) ?Hemoglobin A1c 11.0, poorly controlled.  Home regimen includes Lantus 42 units subcutaneously daily, glipizide 10 mg p.o. twice daily, Victoza 2.1 mg Cornell daily, metformin 1000 mg p.o. twice daily. ?--Diabetic educator consulted ?--Dietitian consulted ?--Holding oral hypoglycemics, Victoza while inpatient ?--Levemir 30 units subcutaneously daily ?--Moderate SSI for coverage ?--CBGs qAC/HS ? ?Hypokalemia ?Repleted. ?--Repeat BMP in a.m. ? ?GERD (gastroesophageal reflux disease) ?--Protonix 40 mg p.o. daily ? ?Tobacco use disorder ?Patient continues to endorse 1 pack/day.  Counseled on need for complete cessation. ?--Bupropion 150 mg p.o. twice daily ?--Nicotine patch ?--Nicotine gum ? ?Hypertriglyceridemia ?Triglycerides 300. ?--Start fenofibrate 54 mg  p.o. daily ? ?Obesity (BMI 30-39.9) ?Discussed with patient needs for aggressive  lifestyle changes/weight loss as this complicates all facets of care.  Outpatient follow-up with PCP.  ? ? ? ? ?DVT prophylaxis: heparin injection 5,000 Units Start: 04/03/21 2230 ?SCD's Start: 04/03/21 2141 ? ?  Code Status: Full Code ?Family Communication: No family present at bedside this morning ? ?Disposition Plan:  ?Level of care: Telemetry Medical ?Status is: Observation ?The patient remains OBS appropriate and will d/c before 2 midnights. ?  ? ?Consultants:  ?Neurology ? ?Procedures:  ?TTE: Pending ? ?Antimicrobials:  ?None ? ? ?Subjective: ?Patient seen examined bedside, resting comfortably.  No specific complaints this morning.  Currently having TTE performed.  Continues with intermittent left facial, left upper extremity paresthesias.  No focal weakness.  Also endorsing nicotine withdrawal and wants to smoke a cigarette.  Discussed findings of acute stroke with patient and need for better control of his diabetes and tobacco cessation.  No other specific complaints or concerns at this time.  Denies headache, no visual changes, no chest pain, no palpitations, no shortness of breath, no abdominal pain, no focal weakness, no cough/congestion, no fever/chills/night sweats, no nausea/vomiting/diarrhea.  No acute events overnight per nursing staff. ? ?Objective: ?Vitals:  ? 04/03/21 2145 04/04/21 0012 04/04/21 0416 04/04/21 0809  ?BP: (!) 130/98 130/87 118/77 (!) 123/92  ?Pulse: 81 79 76 81  ?Resp: 16 19 19 18   ?Temp: 99.2 ?F (37.3 ?C) 97.8 ?F (36.6 ?C) 98.2 ?F (36.8 ?C) 98.6 ?F (37 ?C)  ?TempSrc: Oral Oral Oral Oral  ?SpO2: 98% 95% 96% 97%  ?Weight:      ?Height:      ? ? ?Intake/Output Summary (Last 24 hours) at 04/04/2021 1156 ?Last data filed at 04/04/2021 0800 ?Gross per 24 hour  ?Intake 480 ml  ?Output --  ?Net 480 ml  ? ?Filed Weights  ? 04/03/21 1336  ?Weight: 120.2 kg  ? ? ?Examination: ? ?Physical Exam: ?GEN: NAD, alert and oriented x 3, obese ?HEENT: NCAT, PERRL, EOMI, sclera clear, MMM ?PULM: CTAB w/o  wheezes/crackles, normal respiratory effort ?CV: RRR w/o M/G/R ?GI: abd soft, NTND, NABS, no R/G/M ?MSK: no peripheral edema, moves all extremities independently, strength globally intact ?NEURO: CN II-XII intact, no focal deficits, sensation to light touch decreased left face and left upper extremity ?PSYCH: normal mood/affect ?Integumentary: dry/intact, no rashes or wounds ? ? ? ?Data Reviewed: I have personally reviewed following labs and imaging studies ? ?CBC: ?Recent Labs  ?Lab 04/03/21 ?1538  ?WBC 10.5  ?NEUTROABS 6.6  ?HGB 16.6  ?HCT 46.0  ?MCV 90.9  ?PLT 308  ? ?Basic Metabolic Panel: ?Recent Labs  ?Lab 04/03/21 ?1538  ?NA 132*  ?K 3.4*  ?CL 97*  ?CO2 23  ?GLUCOSE 190*  ?BUN 9  ?CREATININE 0.88  ?CALCIUM 9.0  ? ?GFR: ?Estimated Creatinine Clearance: 141.4 mL/min (by C-G formula based on SCr of 0.88 mg/dL). ?Liver Function Tests: ?Recent Labs  ?Lab 04/03/21 ?1538  ?AST 103*  ?ALT 87*  ?ALKPHOS 85  ?BILITOT 0.9  ?PROT 6.8  ?ALBUMIN 3.4*  ? ?No results for input(s): LIPASE, AMYLASE in the last 168 hours. ?No results for input(s): AMMONIA in the last 168 hours. ?Coagulation Profile: ?No results for input(s): INR, PROTIME in the last 168 hours. ?Cardiac Enzymes: ?No results for input(s): CKTOTAL, CKMB, CKMBINDEX, TROPONINI in the last 168 hours. ?BNP (last 3 results) ?No results for input(s): PROBNP in the last 8760 hours. ?HbA1C: ?Recent Labs  ?  04/04/21 ?0251  ?  HGBA1C 11.0*  ? ?CBG: ?Recent Labs  ?Lab 04/03/21 ?2146 04/04/21 ?3748  ?GLUCAP 154* 180*  ? ?Lipid Profile: ?Recent Labs  ?  04/04/21 ?0251  ?CHOL 166  ?HDL 26*  ?LDLCALC 80  ?TRIG 300*  ?CHOLHDL 6.4  ? ?Thyroid Function Tests: ?No results for input(s): TSH, T4TOTAL, FREET4, T3FREE, THYROIDAB in the last 72 hours. ?Anemia Panel: ?No results for input(s): VITAMINB12, FOLATE, FERRITIN, TIBC, IRON, RETICCTPCT in the last 72 hours. ?Sepsis Labs: ?No results for input(s): PROCALCITON, LATICACIDVEN in the last 168 hours. ? ?No results found for this or any  previous visit (from the past 240 hour(s)).  ? ? ? ? ? ?Radiology Studies: ?MR ANGIO HEAD WO CONTRAST ? ?Result Date: 04/03/2021 ?CLINICAL DATA:  Numbness to left side of head and face, stroke suspected

## 2021-04-04 NOTE — Hospital Course (Signed)
Dylan Ross is a 46 year old male with past medical history significant for HTN, DM2, tobacco use disorder, GERD who initially presented to [facility], ED on 3/30 complaining of left facial numbness.  Onset of symptoms 3 AM on 04/02/21.  He first noticed numbness in his tongue, then moved to his gums, lips, cheek and now his left side of his face, left neck and left upper extremity.  He first noted symptom onset occurring with his blood pressure 280/144.  He reports mostly compliant with his home medications but occasionally skips them for about a week at a time; and endorses no medications for the last 2 days.  Patient denies headache, no difficulty eating/drinking, no visual changes, no slurred speech, no focal weakness.  He initially presented to hospital in [place] on 29 March, they requested admission but he declined as they did not have an answer for his symptoms per his report. ? ?Patient currently endorses smoking a pack a day, drinks alcohol about a sixpack 2 times per week, and intermittent uses of marijuana. ? ?In the ED, temperature 97.9 ?F, HR 84, RR 20, BP 139/96, SPO2 95% on room air.  Sodium 132, potassium 3.4, chloride 97, CO2 23, glucose 190, BUN 9, creatinine 0.88.  AST 103, ALT 87.  WBC 10.5, hemoglobin 16.6, platelets 308.  MR brain without contrast with acute infarct right thalamus, subacute to late subacute infarcts bilateral corona radiata concerning for embolic etiology.  MRA head/neck with multifocal irregularity right A2, right MCA branches, bilateral P2 segments which may indicate multifocal narrowing but also could be artifactual, no hemodynamically significant stenosis in the neck.  Neurology was consulted.  Patient was transferred to [facility] for further evaluation of acute stroke. ?

## 2021-04-04 NOTE — Plan of Care (Signed)
Nutrition Education Note ? ?RD consulted for nutrition education regarding HH diet and DM. ? ?Pt reports receiving education in the past, but unsure what happened to his handouts. Pt reports a low quality diet, only eating 1x/d, sometimes skipping days. Drinking excessive amounts of alcohol once he gets home from work. Pt reports he has lack of motivation and energy, would likely benefit from outpatient mental health services. Encouraged pt to discuss his depressive symptoms with MD. Pt has had several difficult social situations over the past few years. ? ?RD provided "Heart Healthy Carb consistent" handout from the [employer]. Reviewed patient's dietary recall. Provided examples on ways to decrease sodium intake in diet. Discouraged intake of processed foods and use of salt shaker. Encouraged fresh fruits and vegetables as well as whole grain sources of carbohydrates to maximize fiber intake. Discussed different food groups and their effects on blood sugar, emphasizing carbohydrate-containing foods. Provided list of carbohydrates and recommended serving sizes of common foods. ? ?Discussed importance of controlled and consistent carbohydrate intake throughout the day. Provided examples of ways to balance meals/snacks and encouraged intake of high-fiber, whole grain complex carbohydrates.  ? ?RD discussed why it is important for patient to adhere to diet recommendations.  ?Teach back method used. ? ?Expect fair compliance. ? ?Body mass index is 34.18 kg/m?Marland Kitchen Pt meets criteria for obesity based on current BMI. ? ?Current diet order is heart healthy/carb modified,Labs and medications reviewed. No further nutrition interventions warranted at this time. RD contact information provided. If additional nutrition issues arise, please re-consult RD.  ?  ?Dylan Ross, RD, LDN ?Clinical Dietitian ?RD pager # available in [place]  ?After hours/weekend pager # available in [place] ?

## 2021-04-04 NOTE — TOC CAGE-AID Note (Signed)
Transition of Care (TOC) - CAGE-AID Screening ? ? ?Patient Details  ?Name: Dylan Ross ?MRN: [id number] ?Date of Birth: [birth year] ? ?Transition of Care (TOC) CM/SW Contact:    ?Leander Tout C Tarpley-Carter, LCSWA ?Phone Number: ?04/04/2021, 1:09 PM ? ? ?Clinical Narrative: ?Pt participated in Cage-Aid.  Pt stated he does use substance and ETOH.  Pt was offered resources, due to usage of substance and ETOH.    ? ?Insurance underwriter, MSW, LCSW-A ?Pronouns:  She/Her/Hers ?[facility] of [facility] ?Clinical Social Worker ?Direct Number:  [phone] ?[email or web address] ? ?CAGE-AID Screening: ?  ? ?Have You Ever Felt You Ought to Cut Down on Your Drinking or Drug Use?: No ?Have People Annoyed You By Critizing Your Drinking Or Drug Use?: No ?Have You Felt Bad Or Guilty About Your Drinking Or Drug Use?: No ?Have You Ever Had a Drink or Used Drugs First Thing In The Morning to Steady Your Nerves or to Get Rid of a Hangover?: No ?CAGE-AID Score: 0 ? ?Substance Abuse Education Offered: Yes ? ?Substance abuse interventions: Educational Materials ? ? ? ? ? ? ?

## 2021-04-04 NOTE — Plan of Care (Signed)
Pt is alert oriented x 4. Pt ambulatory. Pt is RA. Pt currently completing sleep study. Pt denies pain. Pt has numbness and tingling to left side but is able to move all extremities equally.  ? ? ?Problem: Education: ?Goal: Knowledge of General Education information will improve ?Description: Including pain rating scale, medication(s)/side effects and non-pharmacologic comfort measures ?Outcome: Progressing ?  ?Problem: Health Behavior/Discharge Planning: ?Goal: Ability to manage health-related needs will improve ?Outcome: Progressing ?  ?Problem: Clinical Measurements: ?Goal: Ability to maintain clinical measurements within normal limits will improve ?Outcome: Progressing ?Goal: Will remain free from infection ?Outcome: Progressing ?Goal: Diagnostic test results will improve ?Outcome: Progressing ?Goal: Respiratory complications will improve ?Outcome: Progressing ?Goal: Cardiovascular complication will be avoided ?Outcome: Progressing ?  ?Problem: Activity: ?Goal: Risk for activity intolerance will decrease ?Outcome: Progressing ?  ?Problem: Nutrition: ?Goal: Adequate nutrition will be maintained ?Outcome: Progressing ?  ?Problem: Coping: ?Goal: Level of anxiety will decrease ?Outcome: Progressing ?  ?Problem: Elimination: ?Goal: Will not experience complications related to bowel motility ?Outcome: Progressing ?Goal: Will not experience complications related to urinary retention ?Outcome: Progressing ?  ?Problem: Pain Managment: ?Goal: General experience of comfort will improve ?Outcome: Progressing ?  ?Problem: Safety: ?Goal: Ability to remain free from injury will improve ?Outcome: Progressing ?  ?Problem: Skin Integrity: ?Goal: Risk for impaired skin integrity will decrease ?Outcome: Progressing ?  ?Problem: Education: ?Goal: Knowledge of disease or condition will improve ?Outcome: Progressing ?Goal: Knowledge of secondary prevention will improve (SELECT ALL) ?Outcome: Progressing ?Goal: Knowledge of patient  specific risk factors will improve (INDIVIDUALIZE FOR PATIENT) ?Outcome: Progressing ?  ?Problem: Coping: ?Goal: Will verbalize positive feelings about self ?Outcome: Progressing ?Goal: Will identify appropriate support needs ?Outcome: Progressing ?  ?

## 2021-04-04 NOTE — Evaluation (Signed)
Physical Therapy Evaluation ?Patient Details ?Name: Dylan Ross ?MRN: [id number] ?DOB: July 07, 1975 ?Today's Date: 04/04/2021 ? ?History of Present Illness ? pt is a 46 y/o male presenting to ED with left face numbness.  MRI shows acute infact of the right thalamus and subacte to late subacute infarcts in bilateral corona radiata.  PMHx:    DM, HTN, substance use  ?Clinical Impression ? Pt is at or close to baseline functioning and should be safe at home without assist. There are no further acute PT needs.  Will sign off at this time. ?   ?   ? ?Recommendations for follow up therapy are one component of a multi-disciplinary discharge planning process, led by the attending physician.  Recommendations may be updated based on patient status, additional functional criteria and insurance authorization. ? ?Follow Up Recommendations No PT follow up ? ?  ?Assistance Recommended at Discharge PRN  ?Patient can return home with the following ?   ? ?  ?Equipment Recommendations None recommended by PT  ?Recommendations for Other Services ?    ?  ?Functional Status Assessment Patient has had a recent decline in their functional status and demonstrates the ability to make significant improvements in function in a reasonable and predictable amount of time.  ? ?  ?Precautions / Restrictions Precautions ?Precautions: Fall  ? ?  ? ?Mobility ? Bed Mobility ?Overal bed mobility: Independent ?  ?  ?  ?  ?  ?  ?  ?  ? ?Transfers ?Overall transfer level: Independent ?  ?  ?  ?  ?  ?  ?  ?  ?  ?  ? ?Ambulation/Gait ?Ambulation/Gait assistance: Independent ?Gait Distance (Feet): 500 Feet ?Assistive device: None ?Gait Pattern/deviations: WFL(Within Functional Limits) ?  ?Gait velocity interpretation: >4.37 ft/sec, indicative of normal walking speed ?  ?  ? ?Stairs ?Stairs: Yes ?Stairs assistance: Independent ?Stair Management: No rails, One rail Right, Alternating pattern, Forwards ?Number of Stairs: 10 ?  ? ?Wheelchair Mobility ?  ? ?Modified  Rankin (Stroke Patients Only) ?Modified Rankin (Stroke Patients Only) ?Pre-Morbid Rankin Score: No symptoms ?Modified Rankin: Slight disability ? ?  ? ?Balance Overall balance assessment: Independent ?  ?  ?  ?  ?  ?  ?  ?  ?  ?  ?  ?  ?  ?  ?  ?Standardized Balance Assessment ?Standardized Balance Assessment : Dynamic Gait Index ?  ?Dynamic Gait Index ?Level Surface: Normal ?Change in Gait Speed: Normal ?Gait with Horizontal Head Turns: Normal ?Gait with Vertical Head Turns: Normal ?Gait and Pivot Turn: Normal ?Step Over Obstacle: Normal ?Step Around Obstacles: Normal ?Steps: Normal ?Total Score: 24 ?   ? ? ? ?Pertinent Vitals/Pain Pain Assessment ?Pain Assessment: No/denies pain  ? ? ?Home Living Family/patient expects to be discharged to:: Private residence ?Living Arrangements: Children ?Available Help at Discharge: Family;Available PRN/intermittently ?Type of Home: Mobile home ?Home Access: Stairs to enter ?  ?  ?  ?Home Layout: One level ?Home Equipment: None ?   ?  ?Prior Function Prior Level of Function : Independent/Modified Independent;Working/employed;Driving ?  ?  ?  ?  ?  ?  ?  ?  ?  ? ? ?Hand Dominance  ?   ? ?  ?Extremity/Trunk Assessment  ? Upper Extremity Assessment ?Upper Extremity Assessment: Defer to OT evaluation ?  ? ?Lower Extremity Assessment ?Lower Extremity Assessment: Defer to PT evaluation ?  ? ?Cervical / Trunk Assessment ?Cervical / Trunk Assessment: Normal  ?Communication  ?  Communication: No difficulties  ?Cognition Arousal/Alertness: Awake/alert ?Behavior During Therapy: [facility] for tasks assessed/performed ?Overall Cognitive Status: Within Functional Limits for tasks assessed ?  ?  ?  ?  ?  ?  ?  ?  ?  ?  ?  ?  ?  ?  ?  ?  ?  ?  ?  ? ?  ?General Comments General comments (skin integrity, edema, etc.): Instructed in risk factors and BE FAST. ? ?  ?Exercises    ? ?Assessment/Plan  ?  ?PT Assessment Patient does not need any further PT services  ?PT Problem List   ? ?   ?  ?PT Treatment  Interventions     ? ?PT Goals (Current goals can be found in the Care Plan section)  ?Acute Rehab PT Goals ?PT Goal Formulation: All assessment and education complete, DC therapy ? ?  ?Frequency   ?  ? ? ?Co-evaluation   ?  ?  ?  ?  ? ? ?  ?AM-PAC PT "6 Clicks" Mobility  ?Outcome Measure Help needed turning from your back to your side while in a flat bed without using bedrails?: None ?Help needed moving from lying on your back to sitting on the side of a flat bed without using bedrails?: None ?Help needed moving to and from a bed to a chair (including a wheelchair)?: None ?Help needed standing up from a chair using your arms (e.g., wheelchair or bedside chair)?: None ?Help needed to walk in hospital room?: None ?Help needed climbing 3-5 steps with a railing? : None ?6 Click Score: 24 ? ?  ?End of Session   ?Activity Tolerance: Patient tolerated treatment well ?Patient left: in bed ?Nurse Communication: Mobility status ?  ?  ? ?Time: 2202-5427 ?PT Time Calculation (min) (ACUTE ONLY): 19 min ? ? ?Charges:   PT Evaluation ?$PT Eval Low Complexity: 1 Low ?  ?  ?   ? ? ?04/04/2021 ? ?Jacinto Halim., PT ?Acute Rehabilitation Services ?[phone]  (pager) ?[phone]  (office) ? ?Eliseo Gum Ajamu Maxon ?04/04/2021, 1:17 PM ? ?

## 2021-04-04 NOTE — Evaluation (Signed)
Occupational Therapy Evaluation ?Patient Details ?Name: Dylan Ross ?MRN: [id number] ?DOB: 05-16-1975 ?Today's Date: 04/04/2021 ? ? ?History of Present Illness pt is a 46 y/o male presenting to ED with left face numbness.  MRI shows acute infact of the right thalamus and subacte to late subacute infarcts in bilateral corona radiata.  PMHx:    DM, HTN, substance use  ? ?Clinical Impression ?  ?PTA pt independent and works at a [employer] in [employer]. Pt continues to demonstrate sensory deficits in L hemibody, mostly in face, UE and trunk; however these deficits do not appear to impact him functionally. Pt has STM deficits at baseline and is most likely close to his baseline cognitively.  Educated on signs/symptoms of CVA using BeFast. No further OT needed.  ?   ? ?Recommendations for follow up therapy are one component of a multi-disciplinary discharge planning process, led by the attending physician.  Recommendations may be updated based on patient status, additional functional criteria and insurance authorization.  ? ?Follow Up Recommendations ? No OT follow up  ?  ?Assistance Recommended at Discharge Set up Supervision/Assistance  ?Patient can return home with the following   ? ?  ?Functional Status Assessment ? Patient has not had a recent decline in their functional status  ?Equipment Recommendations ? None recommended by OT  ?  ?Recommendations for Other Services   ? ? ?  ?Precautions / Restrictions Precautions ?Precautions: Fall  ? ?  ? ?Mobility Bed Mobility ?Overal bed mobility: Independent ?  ?  ?  ?  ?  ?  ?  ?  ? ?Transfers ?Overall transfer level: Independent ?  ?  ?  ?  ?  ?  ?  ?  ?  ?  ? ?  ?Balance Overall balance assessment: Independent ?  ?  ?  ?  ?  ?  ?  ?  ?  ?  ?  ?  ?  ?  ?  ?  ?  ?  ?   ? ?ADL either performed or assessed with clinical judgement  ? ?ADL Overall ADL's : At baseline ?  ?  ?  ?  ?  ?  ?  ?  ?  ?  ?  ?  ?  ?  ?  ?  ?  ?  ?  ?   ? ? ? ?Vision Baseline  Vision/History: 1 Wears glasses ?Ability to See in Adequate Light: 0 Adequate ?Patient Visual Report: No change from baseline ?Vision Assessment?: No apparent visual deficits  ?   ?Perception Perception ?Comments: no apparent deficits ?  ?Praxis   ?  ? ?Pertinent Vitals/Pain Pain Assessment ?Pain Assessment: No/denies pain  ? ? ? ?Hand Dominance Right ?  ?Extremity/Trunk Assessment Upper Extremity Assessment ?Upper Extremity Assessment: LUE deficits/detail ?LUE Deficits / Details: numbness in areas of LUE however functional ?Educated on compensatory strategies regarding impaired sensation; able to key in information in computer - uses only 1 finger to type in information ?  ?Lower Extremity Assessment ?Lower Extremity Assessment:  (hx of PN; "B foot pain from "tendon problem") ?  ?Cervical / Trunk Assessment ?Cervical / Trunk Assessment: Normal ?  ?Communication Communication ?Communication: No difficulties ?  ?Cognition Arousal/Alertness: Awake/alert ?Behavior During Therapy: [facility] for tasks assessed/performed ?Overall Cognitive Status: Within Functional Limits for tasks assessed ?  ?  ?  ?  ?  ?  ?  ?  ?  ?  ?  ?  ?  ?  ?  ?  ?  General Comments: memory deficits at baseline ?  ?  ?General Comments  Pt able to copy and input numbers and letters into the computer without difficulty ? ?  ?Exercises   ?  ?Shoulder Instructions    ? ? ?Home Living Family/patient expects to be discharged to:: Private residence ?Living Arrangements: Children ?Available Help at Discharge: Family;Available PRN/intermittently ?Type of Home: Mobile home ?Home Access: Stairs to enter ?  ?  ?Home Layout: One level ?  ?  ?Bathroom Shower/Tub: Tub/shower unit ?  ?Bathroom Toilet: Standard ?Bathroom Accessibility: No ?  ?Home Equipment: None ?  ?  ?  ? ?  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  ?  ?  ?  ?  ?  ?  ?  ?   ? ?  ?  ?OT Problem List: Impaired sensation;Obesity ?  ?   ?OT Treatment/Interventions:    ?  ?OT Goals(Current goals can be found in the care plan section) Acute Rehab OT Goals ?Patient Stated Goal: to go home ?OT Goal Formulation: All assessment and education complete, DC therapy  ?OT Frequency:   ?  ? ?Co-evaluation   ?  ?  ?  ?  ? ?  ?AM-PAC OT "6 Clicks" Daily Activity     ?Outcome Measure Help from another person eating meals?: None ?Help from another person taking care of personal grooming?: None ?Help from another person toileting, which includes using toliet, bedpan, or urinal?: None ?Help from another person bathing (including washing, rinsing, drying)?: None ?Help from another person to put on and taking off regular upper body clothing?: None ?Help from another person to put on and taking off regular lower body clothing?: None ?6 Click Score: 24 ?  ?End of Session Nurse Communication: Other (comment);Mobility status ? ?Activity Tolerance: Patient tolerated treatment well ?Patient left: in bed;with call bell/phone within reach ? ?OT Visit Diagnosis: Other symptoms and signs involving the nervous system (R29.898)  ?              ?Time: 1352-1410 ?OT Time Calculation (min): 18 min ?Charges:  OT General Charges ?$OT Visit: 1 Visit ?OT Evaluation ?$OT Eval Low Complexity: 1 Low ? ?[facility], OT/L  ? ?Acute OT Clinical Specialist ?Acute Rehabilitation Services ?Pager [phone] ?Office [phone]  ? ?Tanya Crothers,HILLARY ?04/04/2021, 3:06 PM ?
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
## 2021-04-04 NOTE — Assessment & Plan Note (Signed)
Discussed with patient needs for aggressive lifestyle changes/weight loss as this complicates all facets of care.  Outpatient follow-up with PCP.  ?

## 2021-04-04 NOTE — Progress Notes (Signed)
?  Transition of Care (TOC) Screening Note ? ? ?Patient Details  ?Name: Dylan Ross ?Date of Birth: [birth year] ? ? ?Transition of Care (TOC) CM/SW Contact:    ?Kermit Balo, RN ?Phone Number: ?04/04/2021, 12:58 PM ? ? ? ?Transition of Care Department [facility]) has reviewed patient and no TOC needs have been identified at this time. We will continue to monitor patient advancement through interdisciplinary progression rounds. If new patient transition needs arise, please place a TOC consult. ?  ?

## 2021-04-04 NOTE — Progress Notes (Signed)
?  Echocardiogram ?2D Echocardiogram has been performed. ? ?Augustine Radar ?04/04/2021, 9:28 AM ?

## 2021-04-04 NOTE — Progress Notes (Signed)
Pt remote telemetry removed. Clear dressing applied to protect IV site. Pt taking a shower. All supplies in reach. Pt educated about calling out for help if needed.  ?

## 2021-04-04 NOTE — Progress Notes (Signed)
On call Neurology paged to inform pt has arrived from [facility]. Dr Clearence Ped informed. ?

## 2021-04-04 NOTE — Assessment & Plan Note (Addendum)
Triglycerides 300. Started fenofibrate 54 mg p.o. daily ?

## 2021-04-04 NOTE — Progress Notes (Signed)
Pt is alert oriented x 4. No distress noted. Pt c/o left sided numbness and tingling. Pt stated it has slightly improved this morning. Pt denies pain. No distress noted.  ?

## 2021-04-05 DIAGNOSIS — E876 Hypokalemia: Secondary | ICD-10-CM | POA: Diagnosis not present

## 2021-04-05 DIAGNOSIS — I639 Cerebral infarction, unspecified: Secondary | ICD-10-CM | POA: Diagnosis not present

## 2021-04-05 DIAGNOSIS — I10 Essential (primary) hypertension: Secondary | ICD-10-CM | POA: Diagnosis not present

## 2021-04-05 DIAGNOSIS — E1165 Type 2 diabetes mellitus with hyperglycemia: Secondary | ICD-10-CM | POA: Diagnosis not present

## 2021-04-05 DIAGNOSIS — K219 Gastro-esophageal reflux disease without esophagitis: Secondary | ICD-10-CM | POA: Diagnosis not present

## 2021-04-05 DIAGNOSIS — G4733 Obstructive sleep apnea (adult) (pediatric): Secondary | ICD-10-CM

## 2021-04-05 LAB — ANTI-DNA ANTIBODY, DOUBLE-STRANDED: ds DNA Ab: 1 IU/mL (ref 0–9)

## 2021-04-05 LAB — GLUCOSE, CAPILLARY
Glucose-Capillary: 203 mg/dL — ABNORMAL HIGH (ref 70–99)
Glucose-Capillary: 272 mg/dL — ABNORMAL HIGH (ref 70–99)
Glucose-Capillary: 174 mg/dL — ABNORMAL HIGH (ref 70–99)
Glucose-Capillary: 170 mg/dL — ABNORMAL HIGH (ref 70–99)

## 2021-04-05 LAB — ANTIPHOSPHOLIPID SYNDROME EVAL, BLD
Anticardiolipin IgA: <9 APL U/mL (ref 0–11)
Anticardiolipin IgG: <9 GPL U/mL (ref 0–14)
Anticardiolipin IgM: <9 MPL U/mL (ref 0–12)
DRVVT: 40 s (ref 0.0–47.0)
PTT Lupus Anticoagulant: 34 s (ref 0.0–43.5)
Phosphatydalserine, IgA: 2 APS Units (ref 0–19)
Phosphatydalserine, IgG: <9 Units (ref 0–30)
Phosphatydalserine, IgM: <10 Units (ref 0–30)

## 2021-04-05 LAB — COMPREHENSIVE METABOLIC PANEL
ALT: 111 U/L — ABNORMAL HIGH (ref 0–44)
AST: 111 U/L — ABNORMAL HIGH (ref 15–41)
Albumin: 2.9 g/dL — ABNORMAL LOW (ref 3.5–5.0)
Alkaline Phosphatase: 75 U/L (ref 38–126)
Anion gap: 9 (ref 5–15)
BUN: 12 mg/dL (ref 6–20)
CO2: 25 mmol/L (ref 22–32)
Calcium: 9.1 mg/dL (ref 8.9–10.3)
Chloride: 101 mmol/L (ref 98–111)
Creatinine, Ser: 0.82 mg/dL (ref 0.61–1.24)
GFR, Estimated: >60 mL/min (ref >60)
Glucose, Bld: 189 mg/dL — ABNORMAL HIGH (ref 70–99)
Potassium: 3.3 mmol/L — ABNORMAL LOW (ref 3.5–5.1)
Sodium: 135 mmol/L (ref 135–145)
Total Bilirubin: 0.5 mg/dL (ref 0.3–1.2)
Total Protein: 6 g/dL — ABNORMAL LOW (ref 6.5–8.1)

## 2021-04-05 LAB — HEPATITIS PANEL, ACUTE
HCV Ab: NONREACTIVE
Hep A IgM: NONREACTIVE
Hep B C IgM: NONREACTIVE
Hepatitis B Surface Ag: NONREACTIVE

## 2021-04-05 LAB — HOMOCYSTEINE: Homocysteine: 12 umol/L (ref 0.0–14.5)

## 2021-04-05 LAB — ANA W/REFLEX IF POSITIVE: Anti Nuclear Antibody (ANA): NEGATIVE

## 2021-04-05 LAB — PROTEIN S ACTIVITY: Protein S Activity: 104 % (ref 63–140)

## 2021-04-05 LAB — MAGNESIUM: Magnesium: 1.5 mg/dL — ABNORMAL LOW (ref 1.7–2.4)

## 2021-04-05 LAB — PROTEIN C ACTIVITY: Protein C Activity: 143 % (ref 73–180)

## 2021-04-05 MED ORDER — POTASSIUM CHLORIDE CRYS ER 20 MEQ PO TBCR
30.0000 meq | EXTENDED_RELEASE_TABLET | ORAL | Status: AC
  Administered 2021-04-05 (×2): 30 meq via ORAL
  Filled 2021-04-05 (×2): qty 1

## 2021-04-05 MED ORDER — INSULIN DETEMIR 100 UNIT/ML ~~LOC~~ SOLN
40.0000 [IU] | Freq: Every day | SUBCUTANEOUS | Status: DC
  Administered 2021-04-05 – 2021-04-06 (×2): 40 [IU] via SUBCUTANEOUS
  Filled 2021-04-05 (×3): qty 0.4

## 2021-04-05 NOTE — Plan of Care (Signed)
  Problem: Pain Managment: Goal: General experience of comfort will improve Outcome: Progressing   Problem: Safety: Goal: Ability to remain free from injury will improve Outcome: Progressing   Problem: Skin Integrity: Goal: Risk for impaired skin integrity will decrease Outcome: Progressing   

## 2021-04-05 NOTE — Evaluation (Signed)
Speech Language Pathology Evaluation ?Patient Details ?Name: Dylan Ross ?MRN: [id number] ?DOB: 04/07/1975 ?Today's Date: 04/05/2021 ?Time: 0955-1010 ?SLP Time Calculation (min) (ACUTE ONLY): 15 min ? ?Problem List:  ?Patient Active Problem List  ? Diagnosis Date Noted  ? Obesity (BMI 30-39.9) 04/04/2021  ? Hypertriglyceridemia 04/04/2021  ? CVA (cerebral vascular accident) (HCC) 04/03/2021  ? HTN (hypertension) 04/03/2021  ? DMII (diabetes mellitus, type 2) (HCC) 04/03/2021  ? Tobacco use disorder 04/03/2021  ? Hypokalemia 04/03/2021  ? GERD (gastroesophageal reflux disease) 05/26/2018  ? Rectal bleeding 03/18/2018  ? Frequent stools 03/18/2018  ? ?Past Medical History:  ?Past Medical History:  ?Diagnosis Date  ? Diabetes mellitus without complication (HCC)   ? Diverticulosis   ? Hypertension   ? Kidney stones   ? ?Past Surgical History:  ?Past Surgical History:  ?Procedure Laterality Date  ? right inguinal hernia repair    ? ?HPI:  ?46 y/o male presenting to ED with left face numbness.  MRI shows acute infact of the right thalamus and subacte to late subacute infarcts in bilateral corona radiata.  PMHx:    DM, HTN, substance use  ? ?Assessment / Plan / Recommendation ?Clinical Impression ? Pt appears to be at baseline for cognitive linguistic functioning s/p CVA. He notes his memory has always been poor (prior to CVAs) though it does not impact his ability with ADLs. Pt oriented x4 and was able to recall hospital events. Informal clinical assessment revealed good insight to deficits, recall of BE-FAST.  Receptive and expressive language and motor speech skills were intact. Pt lives with daughter and has support from family. No further ST needs identified. ?   ?SLP Assessment ? SLP Recommendation/Assessment: Patient does not need any further Speech Lanaguage Pathology Services ?SLP Visit Diagnosis: Cognitive communication deficit (R41.841)  ?  ?Recommendations for follow up therapy are one component of a  multi-disciplinary discharge planning process, led by the attending physician.  Recommendations may be updated based on patient status, additional functional criteria and insurance authorization. ?   ?Follow Up Recommendations ? No SLP follow up  ?  ?Assistance Recommended at Discharge ? None  ?Functional Status Assessment    ?Frequency and Duration  N/a  ?  ?  ?   ?SLP Evaluation ?Cognition ? Overall Cognitive Status: History of cognitive impairments - at baseline ?Arousal/Alertness: Awake/alert ?Orientation Level: Oriented X4 ?Attention: Focused;Sustained ?Focused Attention: Appears intact ?Memory: Impaired ?Memory Impairment: Decreased short term memory ?Awareness: Appears intact ?Problem Solving: Appears intact ?Executive Function: Organizing;Self Monitoring ?Organizing: Appears intact ?Self Monitoring: Appears intact ?Safety/Judgment: Appears intact  ?  ?   ?Comprehension ? Auditory Comprehension ?Overall Auditory Comprehension: Appears within functional limits for tasks assessed ?Reading Comprehension ?Reading Status: Within funtional limits  ?  ?Expression Expression ?Primary Mode of Expression: Verbal ?Verbal Expression ?Overall Verbal Expression: Appears within functional limits for tasks assessed ?Written Expression ?Dominant Hand: Right   ?Oral / Motor ? Oral Motor/Sensory Function ?Overall Oral Motor/Sensory Function: Mild impairment ?Facial Sensation: Reduced left ?Motor Speech ?Overall Motor Speech: Appears within functional limits for tasks assessed   ?        ? ?Nekesha Font H. MA, CCC-SLP ?Acute Rehabilitation Services  ? ?04/05/2021, 10:34 AM ? ?

## 2021-04-05 NOTE — Assessment & Plan Note (Addendum)
Neurology perform sleep study while inpatient which was positive.  Patient will be discharging with home CPAP unit. ?

## 2021-04-05 NOTE — Progress Notes (Signed)
?PROGRESS NOTE ? ? ? ?Dylan Ross  JXB:[id number] DOB: 1975/03/04 DOA: 04/03/2021 ?[facility]: [facility]  ? ? ?Brief Narrative:  ?Dylan Cedarddie Spoto is a 46 year old male with past medical history significant for HTN, DM2, tobacco use disorder, GERD who initially presented to [facility] ED on 3/30 complaining of left facial numbness.  Onset of symptoms 3 AM on 04/02/21.  He first noticed numbness in his tongue, then moved to his gums, lips, cheek and now his left side of his face, left neck and left upper extremity.  He first noted symptom onset occurring with his blood pressure 280/144.  He reports mostly compliant with his home medications but occasionally skips them for about a week at a time; and endorses no medications for the last 2 days.  Patient denies headache, no difficulty eating/drinking, no visual changes, no slurred speech, no focal weakness.  He initially presented to hospital in [place] on 29 March, they requested admission but he declined as they did not have an answer for his symptoms per his report. ? ?Patient currently endorses smoking a pack a day, drinks alcohol about a sixpack 2 times per week, and intermittent uses of marijuana. ? ?In the ED, temperature 97.9 ?F, HR 84, RR 20, BP 139/96, SPO2 95% on room air.  Sodium 132, potassium 3.4, chloride 97, CO2 23, glucose 190, BUN 9, creatinine 0.88.  AST 103, ALT 87.  WBC 10.5, hemoglobin 16.6, platelets 308.  MR brain without contrast with acute infarct right thalamus, subacute to late subacute infarcts bilateral corona radiata concerning for embolic etiology.  MRA head/neck with multifocal irregularity right A2, right MCA branches, bilateral P2 segments which may indicate multifocal narrowing but also could be artifactual, no hemodynamically significant stenosis in the neck.  Neurology was consulted.  [facility] evaluation of acute stroke.  ? ? ?Assessment & Plan: ?  ?Assessment and  Plan: ?* CVA (cerebral vascular accident) (HCC) ?Patient presenting with left-sided facial numbness, paresthesias left upper extremity.  Onset 3 AM on 04/02/2021; initially seen at [facility] but left after admission was recommended.  MR head with acute infarct right thalamus and subacute infarcts of the bilateral corona radiata; concerning for possible embolic etiology.  LDL 80, triglycerides 300.  Hemoglobin A1c 11.0.  TTE with LVEF 60 to 65%, no regional wall motion normalities, trivial MR, IVC normal in size, cannot exclude a small PFO.  Seen by PT/OT/SLP with no needs identified. ?--Neurology following, appreciate assistance ?--DAPT Aspirin 81 mg p.o. daily, Plavix 75 mg p.o. daily x 3 weeks; followed by aspirin alone ?--Crestor 20 mg p.o. daily ?--Continue to monitor on telemetry; currently in NSR; may need outpatient monitor ?--Will need TEE ? ?HTN (hypertension) ?Home medication regimen includes amlodipine 10 mg p.o. daily, atenolol 100 mg p.o. daily, hydrochlorothiazide 25 mg p.o. daily, lisinopril 40 mg p.o. daily, spironolactone 50 mg p.o. daily. ?--Holding home and hypertensives to allow permissive hypertension in the setting of acute CVA to 220/120 ? ?DMII (diabetes mellitus, type 2) (HCC) ?Hemoglobin A1c 11.0, poorly controlled.  Home regimen includes Lantus 42 units subcutaneously daily, glipizide 10 mg p.o. twice daily, Victoza 2.1 mg [place] daily, metformin 1000 mg p.o. twice daily. ?--Diabetic educator consulted ?--Dietitian consulted ?--Holding oral hypoglycemics, Victoza while inpatient ?--Levemir 30 units subcutaneously daily ?--Moderate SSI for coverage ?--CBGs qAC/HS ? ?Hypokalemia ?Potassium 3.3 this morning, will replete. ?--Repeat BMP in a.m. ? ?GERD (gastroesophageal reflux disease) ?--Protonix 40 mg p.o. daily ? ?  Tobacco use disorder ?Patient continues to endorse 1 pack/day.  Counseled on need for complete cessation. ?--Bupropion 150 mg p.o. twice daily ?--Nicotine patch ?--Nicotine  gum ? ?OSA (obstructive sleep apnea) ?Currently undergoing sleep study per neurology, positive study overnight and pending titration study tonight. ? ?Hypertriglyceridemia ?Triglycerides 300. ?--Start fenofibrate 54 mg p.o. daily ? ?Obesity (BMI 30-39.9) ?Discussed with patient needs for aggressive lifestyle changes/weight loss as this complicates all facets of care.  Outpatient follow-up with PCP.  ? ? ? ? ?DVT prophylaxis: heparin injection 5,000 Units Start: 04/03/21 2230 ?SCD's Start: 04/03/21 2141 ? ?  Code Status: Full Code ?Family Communication: No family present at bedside this morning ? ?Disposition Plan:  ?Level of care: Telemetry Medical ?Status is: Observation ?The patient remains OBS appropriate and will d/c before 2 midnights. ?  ? ?Consultants:  ?Neurology ? ?Procedures:  ?TTE: Pending ? ?Antimicrobials:  ?None ? ? ?Subjective: ?Patient seen examined bedside, resting comfortably.  No specific complaints this morning.  First part of sleep study overnight positive for sleep apnea, pending titration study tonight.  Discussed with neurology, will need TEE given possible small PFO on TTE.  No other specific complaints or concerns at this time.  Denies headache, no visual changes, no chest pain, no palpitations, no shortness of breath, no abdominal pain, no focal weakness, no cough/congestion, no fever/chills/night sweats, no nausea/vomiting/diarrhea.  No acute events overnight per nursing staff. ? ?Objective: ?Vitals:  ? 04/05/21 0039 04/05/21 0342 04/05/21 0749 04/05/21 1208  ?BP: 112/74 121/80 (!) 144/105 (!) 149/119  ?Pulse: 82 77 88 90  ?Resp: 20 20 20 20   ?Temp: 98.8 ?F (37.1 ?C) 98.1 ?F (36.7 ?C) 98.5 ?F (36.9 ?C) 98.8 ?F (37.1 ?C)  ?TempSrc: Axillary Oral Oral Oral  ?SpO2: 95% 98% 99% 99%  ?Weight:      ?Height:      ? ? ?Intake/Output Summary (Last 24 hours) at 04/05/2021 1343 ?Last data filed at 04/05/2021 1216 ?Gross per 24 hour  ?Intake 1072 ml  ?Output 0 ml  ?Net 1072 ml  ? ?Filed Weights  ?  04/03/21 1336  ?Weight: 120.2 kg  ? ? ?Examination: ? ?Physical Exam: ?GEN: NAD, alert and oriented x 3, obese ?HEENT: NCAT, PERRL, EOMI, sclera clear, MMM ?PULM: CTAB w/o wheezes/crackles, normal respiratory effort ?CV: RRR w/o M/G/R ?GI: abd soft, NTND, NABS, no R/G/M ?MSK: no peripheral edema, moves all extremities independently, strength globally intact ?NEURO: CN II-XII intact, no focal deficits, sensation to light touch decreased left face and left upper extremity ?PSYCH: normal mood/affect ?Integumentary: dry/intact, no rashes or wounds ? ? ? ?Data Reviewed: I have personally reviewed following labs and imaging studies ? ?CBC: ?Recent Labs  ?Lab 04/03/21 ?1538  ?WBC 10.5  ?NEUTROABS 6.6  ?HGB 16.6  ?HCT 46.0  ?MCV 90.9  ?PLT 308  ? ?Basic Metabolic Panel: ?Recent Labs  ?Lab 04/03/21 ?1538 04/05/21 ?0232  ?NA 132* 135  ?K 3.4* 3.3*  ?CL 97* 101  ?CO2 23 25  ?GLUCOSE 190* 189*  ?BUN 9 12  ?CREATININE 0.88 0.82  ?CALCIUM 9.0 9.1  ?MG  --  1.5*  ? ?GFR: ?Estimated Creatinine Clearance: 151.7 mL/min (by C-G formula based on SCr of 0.82 mg/dL). ?Liver Function Tests: ?Recent Labs  ?Lab 04/03/21 ?1538 04/05/21 ?0232  ?AST 103* 111*  ?ALT 87* 111*  ?ALKPHOS 85 75  ?BILITOT 0.9 0.5  ?PROT 6.8 6.0*  ?ALBUMIN 3.4* 2.9*  ? ?No results for input(s): LIPASE, AMYLASE in the last 168 hours. ?No results for  input(s): AMMONIA in the last 168 hours. ?Coagulation Profile: ?No results for input(s): INR, PROTIME in the last 168 hours. ?Cardiac Enzymes: ?No results for input(s): CKTOTAL, CKMB, CKMBINDEX, TROPONINI in the last 168 hours. ?BNP (last 3 results) ?No results for input(s): PROBNP in the last 8760 hours. ?HbA1C: ?Recent Labs  ?  04/04/21 ?0251  ?HGBA1C 11.0*  ? ?CBG: ?Recent Labs  ?Lab 04/04/21 ?1235 04/04/21 ?1724 04/04/21 ?2206 04/05/21 ?9417 04/05/21 ?1212  ?GLUCAP 266* 217* 202* 203* 272*  ? ?Lipid Profile: ?Recent Labs  ?  04/04/21 ?0251  ?CHOL 166  ?HDL 26*  ?LDLCALC 80  ?TRIG 300*  ?CHOLHDL 6.4  ? ?Thyroid Function  Tests: ?No results for input(s): TSH, T4TOTAL, FREET4, T3FREE, THYROIDAB in the last 72 hours. ?Anemia Panel: ?No results for input(s): VITAMINB12, FOLATE, FERRITIN, TIBC, IRON, RETICCTPCT in the last 72 hour

## 2021-04-05 NOTE — Progress Notes (Signed)
STROKE TEAM PROGRESS NOTE  ?INTERVAL HISTORY ?Mr. Dylan Ross is a 46 y.o. male with history of DM2, HTN, new HLD, tobacco abuse, EtOH abuse, remote cannabis abuse presenting (04/03/2021) with L facial and arm numbness.  ?04/02/2021, patient initially presented to [facility] ED for progressive left sided tingling, but reported being discharged. ?04/03/2021, patient had recurrent symptoms and presented to [facility] ED for stroke evaluation.  ?During this time, he denied weakness, speech changes or difficulties, dizziness, diplopia, confusion, headache. Reported just having paresthesia starting in left face then to left arm and leg. Denied absence of sensation.  ?____________________________________ ?He wants to go home. Reports doing much bettter.  Still with left side paresthesia but much improved.  ?No family at bedside.  ? ?Vitals:  ? 04/05/21 0039 04/05/21 0342 04/05/21 0749 04/05/21 1208  ?BP: 112/74 121/80 (!) 144/105 (!) 149/119  ?Pulse: 82 77 88 90  ?Resp: 20 20 20 20   ?Temp: 98.8 ?F (37.1 ?C) 98.1 ?F (36.7 ?C) 98.5 ?F (36.9 ?C) 98.8 ?F (37.1 ?C)  ?TempSrc: Axillary Oral Oral Oral  ?SpO2: 95% 98% 99% 99%  ?Weight:      ?Height:      ? ?CBC:  ?Recent Labs  ?Lab 04/03/21 ?1538  ?WBC 10.5  ?NEUTROABS 6.6  ?HGB 16.6  ?HCT 46.0  ?MCV 90.9  ?PLT 308  ? ? ?Basic Metabolic Panel:  ?Recent Labs  ?Lab 04/03/21 ?1538 04/05/21 ?0232  ?NA 132* 135  ?K 3.4* 3.3*  ?CL 97* 101  ?CO2 23 25  ?GLUCOSE 190* 189*  ?BUN 9 12  ?CREATININE 0.88 0.82  ?CALCIUM 9.0 9.1  ?MG  --  1.5*  ? ? ?Lipid Panel:  ?Recent Labs  ?Lab 04/04/21 ?0251  ?CHOL 166  ?TRIG 300*  ?HDL 26*  ?CHOLHDL 6.4  ?VLDL 60*  ?[place] 80  ? ? ?HgbA1c:  ?Recent Labs  ?Lab 04/04/21 ?0251  ?HGBA1C 11.0*  ? ? ?Urine Drug Screen:  ?No results for input(s): LABOPIA, COCAINSCRNUR, LABBENZ, AMPHETMU, THCU, LABBARB in the last 168 hours. ?Alcohol Level No results for input(s): ETH in the last 168 hours. ? ?IMAGING past 24 hours ?No results found. ? ?PHYSICAL  EXAM ?Constitutional: Obese middle-age male in no apparent distress. ?Psych: Affect appropriate to situation ?Eyes: No scleral injection ?Respiratory: Effort normal, non-labored breathing ?Gait and Station: Deferred ? ?Neuro: ?Mental Status: ?Patient is awake, alert, oriented to person, place, month, year, and situation. ?Speech is clear, coherent, spontaneous  ?Word repetition, naming intact ?Comprehension intact and able to follow commands ? ?Cranial Nerves: ?II: Visual Fields are full. Pupils are equal, round, and reactive to light.   ?III,IV, VI: EOMI without ptosis or diploplia.  ?V: Facial sensation is symmetric to temperature ?VII: Facial movement is symmetric resting and smiling ?VIII: Hearing is intact to voice ?X: Palate elevates symmetrically ?XI: Shoulder shrug and chin turning is symmetric ?XII: Tongue protrudes midline without atrophy or fasciculations ?Motor ?Tone is normal. Bulk is normal. 5/5 strength was present in all four extremities and pronator drift was absent.  ?Sensory: ?Sensation is intact and temperature in the arms and legs. Left sided paresthesia in face, arm and leg ?Cerebellar: ?No ataxia ? ?NIHSS 1  Premorbid MRS 0 ? ?ASSESSMENT/PLAN ?Mr. Dylan Ross is a 46 y.o. male with history of DM2, HTN, new HLD, tobacco abuse, EtOH abuse, remove cannabis abuse presenting (04/03/2021) with L facial and arm numbness x2 days prior to admission.  ? ?He is eligible for sleep smart study. He is interested in enrolling.  CPAP autotitration tonight then will get randomized tomorrow by sleep coordinator. TEE on Monday. He needs to be at work by Wednesday and told him he can likely go home Monday after his TEE. ? ? 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 Multifocal irregularity in the  right A2, right MCA branches, and bilateral P2 segments ?MRA: No evidence of dissection, occlusion, or hemodynamically significant stenosis (greater than 50%) in bilateral carotids and vertebral arteries. No LVOs ?2D Echo: Pending ?LDL 80  (04/04/2021) ?HgbA1c 11.0 (04/04/2021)  ?VTE prophylaxis - Heparin injection 5,000 units start: 04/03/21 2230 ?Scd's start: 04/03/21 2141, Other (Comment) (heparin),  ?Diet:   ?No antithrombic prior to admission, now on aspirin 81 mg daily and clopidogrel 75 mg daily for 3 weeks, then aspirin 81 mg daily monotherapy ?Therapy recommendations: No PT follow up, No OT follow up, No SLP follow up, None recommended by PT Pending (04/04/21)  ?Disposition: No PT follow up Pending ? ?Hypertension ?Home meds: Amlodipine 10 mg daily, atenolol 100 mg daily, HCTZ 25 mg daily, lisinopril 40 mg daily, spironolactone 50 mg daily - sporadic use at home, HOLDING until 4/1 unless normotensive ?Stable ?Permissive hypertension (OK if < 220/120) 1-2 days but gradually normalize in 5-7 days ?Long-term BP goal normotensive ? ?Hyperlipidemia-new ?Home meds:  N/A ?LDL 80  (04/04/2021), goal < 70  ?Fenofibrate 54 mg daily for triglyceride  ?Added Crestor 20 mg qHS ?High intensity statin  ?Continue statin at discharge ? ?Diabetes type II Uncontrolled ?Home meds: glipiZIDE (GLUCOTROL) 10 MG tablet, metFORMIN (GLUCOPHAGE) 1000 MG tablet, liraglutide (VICTOZA) 18 MG/3ML SOPN ?HgbA1c 11.0 (04/04/2021), goal < 7.0 ?CBGs ?SSI ?Recent Labs  ?  04/04/21 ?2206 04/05/21 ?0609 04/05/21 ?1212  ?GLUCAP 202* 203* 272*  ? ? ? ?Other Stroke Risk Factors ?Obesity, body mass index is 36.96 kg/m?., BMI >/= 30 associated with increased stroke risk, recommended weight loss, diet and exercise as appropriate  ?Hx stroke/TIA - seen on imaging ?Cigarette smoker, advised to stop smoking. NRT & Wellbutrin 150 mg BID ?ETOH use, advised to drink no more than 1-2 drink(s) a day. Gabapentin 600 mg TID ?Substance abuse - cannabis Patient  advised to stop using due to stroke risk. UDS pending ?Family hx stroke - multiple in mother ?Hypercoagulable labs pending per primary ? ?Other Active Problems ?GERD: Protonix 40 mg  ?H/o Kidney stones  ? ? ?Hospital day # 0 ? ?Signed: ?Dorene Grebe, MD ?Resident, PGY-1 ?[place] ?04/05/2021, 1:37 PM  ? ? ?Total of 35 mins spent reviewing chart, discussion with patient and family on prognosis, Dx and plan. Discussed case with patient's nurse. Reviewed Imaging personally.  ?To contact Stroke Continuity provider, please refer to [email or web address] ?After hours, contact General Neurology  ?

## 2021-04-06 DIAGNOSIS — E1165 Type 2 diabetes mellitus with hyperglycemia: Secondary | ICD-10-CM | POA: Diagnosis not present

## 2021-04-06 DIAGNOSIS — I10 Essential (primary) hypertension: Secondary | ICD-10-CM | POA: Diagnosis not present

## 2021-04-06 DIAGNOSIS — K219 Gastro-esophageal reflux disease without esophagitis: Secondary | ICD-10-CM | POA: Diagnosis not present

## 2021-04-06 DIAGNOSIS — E781 Pure hyperglyceridemia: Secondary | ICD-10-CM | POA: Diagnosis not present

## 2021-04-06 LAB — MAGNESIUM: Magnesium: 1.4 mg/dL — ABNORMAL LOW (ref 1.7–2.4)

## 2021-04-06 LAB — COMPREHENSIVE METABOLIC PANEL
ALT: 110 U/L — ABNORMAL HIGH (ref 0–44)
AST: 96 U/L — ABNORMAL HIGH (ref 15–41)
Albumin: 3 g/dL — ABNORMAL LOW (ref 3.5–5.0)
Alkaline Phosphatase: 68 U/L (ref 38–126)
Anion gap: 8 (ref 5–15)
BUN: 12 mg/dL (ref 6–20)
CO2: 24 mmol/L (ref 22–32)
Calcium: 8.9 mg/dL (ref 8.9–10.3)
Chloride: 104 mmol/L (ref 98–111)
Creatinine, Ser: 0.76 mg/dL (ref 0.61–1.24)
GFR, Estimated: >60 mL/min (ref >60)
Glucose, Bld: 153 mg/dL — ABNORMAL HIGH (ref 70–99)
Potassium: 3.5 mmol/L (ref 3.5–5.1)
Sodium: 136 mmol/L (ref 135–145)
Total Bilirubin: 0.6 mg/dL (ref 0.3–1.2)
Total Protein: 5.9 g/dL — ABNORMAL LOW (ref 6.5–8.1)

## 2021-04-06 LAB — GLUCOSE, CAPILLARY
Glucose-Capillary: 141 mg/dL — ABNORMAL HIGH (ref 70–99)
Glucose-Capillary: 147 mg/dL — ABNORMAL HIGH (ref 70–99)
Glucose-Capillary: 151 mg/dL — ABNORMAL HIGH (ref 70–99)
Glucose-Capillary: 149 mg/dL — ABNORMAL HIGH (ref 70–99)

## 2021-04-06 MED ORDER — LISINOPRIL 20 MG PO TABS
40.0000 mg | ORAL_TABLET | Freq: Every day | ORAL | Status: DC
Start: 2021-04-06 — End: 2021-04-07
  Administered 2021-04-06 – 2021-04-07 (×2): 40 mg via ORAL
  Filled 2021-04-06 (×2): qty 2

## 2021-04-06 MED ORDER — AMLODIPINE BESYLATE 10 MG PO TABS
10.0000 mg | ORAL_TABLET | Freq: Every day | ORAL | Status: DC
Start: 2021-04-06 — End: 2021-04-07
  Administered 2021-04-06 – 2021-04-07 (×2): 10 mg via ORAL
  Filled 2021-04-06 (×2): qty 1

## 2021-04-06 MED ORDER — POTASSIUM CHLORIDE CRYS ER 20 MEQ PO TBCR
40.0000 meq | EXTENDED_RELEASE_TABLET | Freq: Once | ORAL | Status: AC
  Administered 2021-04-06: 40 meq via ORAL
  Filled 2021-04-06: qty 2

## 2021-04-06 MED ORDER — MAGNESIUM SULFATE 4 GM/100ML IV SOLN
4.0000 g | Freq: Once | INTRAVENOUS | Status: AC
  Administered 2021-04-06: 4 g via INTRAVENOUS
  Filled 2021-04-06: qty 100

## 2021-04-06 NOTE — Plan of Care (Signed)
Pt is alert oriented x 4. Ambulatory. Pt c/o numbness and tingling to left side. Pt denies pain. Pt had shower tonight and was off telemetry for duration of shower. Pt has cpap on at this time.  ? ? ?Problem: Education: ?Goal: Knowledge of General Education information will improve ?Description: Including pain rating scale, medication(s)/side effects and non-pharmacologic comfort measures ?Outcome: Progressing ?  ?Problem: Health Behavior/Discharge Planning: ?Goal: Ability to manage health-related needs will improve ?Outcome: Progressing ?  ?Problem: Clinical Measurements: ?Goal: Ability to maintain clinical measurements within normal limits will improve ?Outcome: Progressing ?Goal: Will remain free from infection ?Outcome: Progressing ?Goal: Diagnostic test results will improve ?Outcome: Progressing ?Goal: Respiratory complications will improve ?Outcome: Progressing ?Goal: Cardiovascular complication will be avoided ?Outcome: Progressing ?  ?Problem: Activity: ?Goal: Risk for activity intolerance will decrease ?Outcome: Progressing ?  ?Problem: Nutrition: ?Goal: Adequate nutrition will be maintained ?Outcome: Progressing ?  ?Problem: Coping: ?Goal: Level of anxiety will decrease ?Outcome: Progressing ?  ?Problem: Elimination: ?Goal: Will not experience complications related to bowel motility ?Outcome: Progressing ?Goal: Will not experience complications related to urinary retention ?Outcome: Progressing ?  ?Problem: Pain Managment: ?Goal: General experience of comfort will improve ?Outcome: Progressing ?  ?Problem: Safety: ?Goal: Ability to remain free from injury will improve ?Outcome: Progressing ?  ?Problem: Skin Integrity: ?Goal: Risk for impaired skin integrity will decrease ?Outcome: Progressing ?  ?Problem: Education: ?Goal: Knowledge of disease or condition will improve ?Outcome: Progressing ?Goal: Knowledge of secondary prevention will improve (SELECT ALL) ?Outcome: Progressing ?Goal: Knowledge of patient  specific risk factors will improve (INDIVIDUALIZE FOR PATIENT) ?Outcome: Progressing ?  ?Problem: Coping: ?Goal: Will verbalize positive feelings about self ?Outcome: Progressing ?Goal: Will identify appropriate support needs ?Outcome: Progressing ?  ?

## 2021-04-06 NOTE — Progress Notes (Signed)
?PROGRESS NOTE ? ? ? ?Dylan Ross  WUJ:[id number] DOB: 1975-10-16 DOA: 04/03/2021 ?PCP: [facility]  ? ? ?Brief Narrative:  ?Dylan Ross is a 46 year old male with past medical history significant for HTN, DM2, tobacco use disorder, GERD who initially presented to [facility], ED on 3/30 complaining of left facial numbness.  Onset of symptoms 3 AM on 04/02/21.  He first noticed numbness in his tongue, then moved to his gums, lips, cheek and now his left side of his face, left neck and left upper extremity.  He first noted symptom onset occurring with his blood pressure 280/144.  He reports mostly compliant with his home medications but occasionally skips them for about a week at a time; and endorses no medications for the last 2 days.  Patient denies headache, no difficulty eating/drinking, no visual changes, no slurred speech, no focal weakness.  He initially presented to hospital in [place] on 29 March, they requested admission but he declined as they did not have an answer for his symptoms per his report. ? ?Patient currently endorses smoking a pack a day, drinks alcohol about a sixpack 2 times per week, and intermittent uses of marijuana. ? ?In the ED, temperature 97.9 ?F, HR 84, RR 20, BP 139/96, SPO2 95% on room air.  Sodium 132, potassium 3.4, chloride 97, CO2 23, glucose 190, BUN 9, creatinine 0.88.  AST 103, ALT 87.  WBC 10.5, hemoglobin 16.6, platelets 308.  MR brain without contrast with acute infarct right thalamus, subacute to late subacute infarcts bilateral corona radiata concerning for embolic etiology.  MRA head/neck with multifocal irregularity right A2, right MCA branches, bilateral P2 segments which may indicate multifocal narrowing but also could be artifactual, no hemodynamically significant stenosis in the neck.  Neurology was consulted.  Patient was transferred to [facility] for further evaluation of acute stroke.  ? ?  ?Assessment and Plan: ?* CVA (cerebral  vascular accident) (HCC) ?Patient presenting with left-sided facial numbness, paresthesias left upper extremity.  Onset 3 AM on 04/02/2021; initially seen at [facility] but left after admission was recommended.  MR head with acute infarct right thalamus and subacute infarcts of the bilateral corona radiata; concerning for possible embolic etiology.  LDL 80, triglycerides 300.  Hemoglobin A1c 11.0.  TTE with LVEF 60 to 65%, no regional wall motion normalities, trivial MR, IVC normal in size, cannot exclude a small PFO.  Seen by PT/OT/SLP with no needs identified. ?--Neurology following, appreciate assistance ?--DAPT Aspirin 81 mg p.o. daily, Plavix 75 mg p.o. daily x 3 weeks; followed by aspirin alone ?--Crestor 20 mg p.o. daily ?--Continue to monitor on telemetry; currently in NSR; may need outpatient monitor ?--Will need TEE per neurology ? ?HTN (hypertension) ?Home medication regimen includes amlodipine 10 mg p.o. daily, atenolol 100 mg p.o. daily, hydrochlorothiazide 25 mg p.o. daily, lisinopril 40 mg p.o. daily, spironolactone 50 mg p.o. daily. ?--restart amlodipine 10mg  PO daily and lisinopril 40mg  PO daily today ?--continue to monitor BP  ? ?DMII (diabetes mellitus, type 2) (HCC) ?Hemoglobin A1c 11.0, poorly controlled.  Home regimen includes Lantus 42 units subcutaneously daily, glipizide 10 mg p.o. twice daily, Victoza 2.1 mg Erma daily, metformin 1000 mg p.o. twice daily. ?--Diabetic educator and Dietitian following ?--Holding oral hypoglycemics, Victoza while inpatient ?--Levemir 40 units subcutaneously daily ?--Moderate SSI for coverage ?--CBGs qAC/HS ? ?Hypokalemia ?Potassium 3.4 this morning, will replete. ? ? ?GERD (gastroesophageal reflux disease) ?--Protonix 40 mg p.o. daily ? ?Tobacco use disorder ?Patient continues  to endorse 1 pack/day.  Counseled on need for complete cessation. ?--Bupropion 150 mg p.o. twice daily ?--Nicotine patch ?--Nicotine gum ? ?Hypomagnesemia ?Magnesium 1.4, will  replete. ? ?OSA (obstructive sleep apnea) ?Currently undergoing sleep study per neurology, positive study overnight and pending titration study results and randomization ? ?Hypertriglyceridemia ?Triglycerides 300. ?--Start fenofibrate 54 mg p.o. daily ? ?Obesity (BMI 30-39.9) ?Discussed with patient needs for aggressive lifestyle changes/weight loss as this complicates all facets of care.  Outpatient follow-up with PCP.  ? ? ? ? ?DVT prophylaxis: heparin injection 5,000 Units Start: 04/03/21 2230 ?SCD's Start: 04/03/21 2141 ? ?  Code Status: Full Code ?Family Communication: No family present at bedside this morning ? ?Disposition Plan:  ?Level of care: Telemetry Medical ?Status is: Observation ?The patient remains OBS appropriate and will d/c before 2 midnights. ?  ? ?Consultants:  ?Neurology ? ?Procedures:  ?TTE: Pending ? ?Antimicrobials:  ?None ? ? ?Subjective: ?Patient seen examined bedside, resting comfortably.  No specific complaints this morning.  Awaiting for results of titration study overnight and randomization per neurology for OSA.  Will need TEE given possible small PFO on TTE, likely tomorrow.  No other specific complaints or concerns at this time.  Denies headache, no visual changes, no chest pain, no palpitations, no shortness of breath, no abdominal pain, no focal weakness, no cough/congestion, no fever/chills/night sweats, no nausea/vomiting/diarrhea.  No acute events overnight per nursing staff. ? ?Objective: ?Vitals:  ? 04/05/21 1900 04/05/21 2349 04/06/21 0403 04/06/21 0802  ?BP: (!) 148/100 (!) 165/116 (!) 154/112 (!) 175/109  ?Pulse: 79 79 78 87  ?Resp: (!) 22 (!) 21 20 19   ?Temp: 98.3 ?F (36.8 ?C) 97.7 ?F (36.5 ?C) 98.6 ?F (37 ?C) 99 ?F (37.2 ?C)  ?TempSrc: Oral Oral Oral Oral  ?SpO2: 99% 93% 97% 100%  ?Weight:      ?Height:      ? ? ?Intake/Output Summary (Last 24 hours) at 04/06/2021 1016 ?Last data filed at 04/06/2021 0755 ?Gross per 24 hour  ?Intake 1063 ml  ?Output --  ?Net 1063 ml  ? ?Filed  Weights  ? 04/03/21 1336  ?Weight: 120.2 kg  ? ? ?Examination: ? ?Physical Exam: ?GEN: NAD, alert and oriented x 3, obese ?HEENT: NCAT, PERRL, EOMI, sclera clear, MMM ?PULM: CTAB w/o wheezes/crackles, normal respiratory effort ?CV: RRR w/o M/G/R ?GI: abd soft, NTND, NABS, no R/G/M ?MSK: no peripheral edema, moves all extremities independently, strength globally intact ?NEURO: CN II-XII intact, no focal deficits, sensation to light touch decreased left face and left upper extremity ?PSYCH: normal mood/affect ?Integumentary: dry/intact, no rashes or wounds ? ? ? ?Data Reviewed: I have personally reviewed following labs and imaging studies ? ?CBC: ?Recent Labs  ?Lab 04/03/21 ?1538  ?WBC 10.5  ?NEUTROABS 6.6  ?HGB 16.6  ?HCT 46.0  ?MCV 90.9  ?PLT 308  ? ?Basic Metabolic Panel: ?Recent Labs  ?Lab 04/03/21 ?1538 04/05/21 ?0232 04/06/21 ?0237  ?NA 132* 135 136  ?K 3.4* 3.3* 3.5  ?CL 97* 101 104  ?CO2 23 25 24   ?GLUCOSE 190* 189* 153*  ?BUN 9 12 12   ?CREATININE 0.88 0.82 0.76  ?CALCIUM 9.0 9.1 8.9  ?MG  --  1.5* 1.4*  ? ?GFR: ?Estimated Creatinine Clearance: 155.5 mL/min (by C-G formula based on SCr of 0.76 mg/dL). ?Liver Function Tests: ?Recent Labs  ?Lab 04/03/21 ?1538 04/05/21 ?0232 04/06/21 ?0237  ?AST 103* 111* 96*  ?ALT 87* 111* 110*  ?ALKPHOS 85 75 68  ?BILITOT 0.9 0.5 0.6  ?PROT 6.8  6.0* 5.9*  ?ALBUMIN 3.4* 2.9* 3.0*  ? ?No results for input(s): LIPASE, AMYLASE in the last 168 hours. ?No results for input(s): AMMONIA in the last 168 hours. ?Coagulation Profile: ?No results for input(s): INR, PROTIME in the last 168 hours. ?Cardiac Enzymes: ?No results for input(s): CKTOTAL, CKMB, CKMBINDEX, TROPONINI in the last 168 hours. ?BNP (last 3 results) ?No results for input(s): PROBNP in the last 8760 hours. ?HbA1C: ?Recent Labs  ?  04/04/21 ?0251  ?HGBA1C 11.0*  ? ?CBG: ?Recent Labs  ?Lab 04/05/21 ?0609 04/05/21 ?1212 04/05/21 ?1607 04/05/21 ?2139 04/06/21 ?0618  ?GLUCAP 203* 272* 174* 170* 141*  ? ?Lipid Profile: ?Recent  Labs  ?  04/04/21 ?0251  ?CHOL 166  ?HDL 26*  ?LDLCALC 80  ?TRIG 300*  ?CHOLHDL 6.4  ? ?Thyroid Function Tests: ?No results for input(s): TSH, T4TOTAL, FREET4, T3FREE, THYROIDAB in the last 72 hours. ?Ane

## 2021-04-06 NOTE — Plan of Care (Signed)
?  Problem: Education: ?Goal: Knowledge of General Education information will improve ?Description: Including pain rating scale, medication(s)/side effects and non-pharmacologic comfort measures ?04/06/2021 0942 by Merrilyn Puma, RN ?Outcome: Progressing ?04/06/2021 0942 by Merrilyn Puma, RN ?Outcome: Progressing ?  ?Problem: Health Behavior/Discharge Planning: ?Goal: Ability to manage health-related needs will improve ?04/06/2021 0942 by Merrilyn Puma, RN ?Outcome: Progressing ?04/06/2021 0942 by Merrilyn Puma, RN ?Outcome: Progressing ?  ?Problem: Clinical Measurements: ?Goal: Respiratory complications will improve ?04/06/2021 0942 by Merrilyn Puma, RN ?Outcome: Progressing ?04/06/2021 0942 by Merrilyn Puma, RN ?Outcome: Progressing ?  ?Problem: Activity: ?Goal: Risk for activity intolerance will decrease ?04/06/2021 0942 by Merrilyn Puma, RN ?Outcome: Progressing ?04/06/2021 0942 by Merrilyn Puma, RN ?Outcome: Progressing ?  ?Problem: Nutrition: ?Goal: Adequate nutrition will be maintained ?04/06/2021 0942 by Merrilyn Puma, RN ?Outcome: Progressing ?04/06/2021 0942 by Merrilyn Puma, RN ?Outcome: Progressing ?  ?Problem: Elimination: ?Goal: Will not experience complications related to bowel motility ?04/06/2021 0942 by Merrilyn Puma, RN ?Outcome: Progressing ?04/06/2021 0942 by Merrilyn Puma, RN ?Outcome: Progressing ?Goal: Will not experience complications related to urinary retention ?04/06/2021 0942 by Merrilyn Puma, RN ?Outcome: Progressing ?04/06/2021 0942 by Merrilyn Puma, RN ?Outcome: Progressing ?  ?Problem: Pain Managment: ?Goal: General experience of comfort will improve ?04/06/2021 0942 by Merrilyn Puma, RN ?Outcome: Progressing ?04/06/2021 0942 by Merrilyn Puma, RN ?Outcome: Progressing ?  ?Problem: Safety: ?Goal: Ability to remain free from injury will improve ?04/06/2021 0942 by Merrilyn Puma, RN ?Outcome: Progressing ?04/06/2021 0942 by Merrilyn Puma, RN ?Outcome:  Progressing ?  ?Problem: Skin Integrity: ?Goal: Risk for impaired skin integrity will decrease ?04/06/2021 0942 by Merrilyn Puma, RN ?Outcome: Progressing ?04/06/2021 0942 by Merrilyn Puma, RN ?Outcome: Progressing ?  ?Problem: Education: ?Goal: Knowledge of disease or condition will improve ?Outcome: Progressing ?Goal: Knowledge of patient specific risk factors will improve (INDIVIDUALIZE FOR PATIENT) ?Outcome: Progressing ?  ?Problem: Coping: ?Goal: Will identify appropriate support needs ?Outcome: Progressing ?  ?

## 2021-04-06 NOTE — Assessment & Plan Note (Addendum)
Repleted during hospitalization. °

## 2021-04-06 NOTE — H&P (View-Only) (Signed)
?PROGRESS NOTE ? ? ? ?Dylan Ross  WUJ:[id number] DOB: 1975-10-16 DOA: 04/03/2021 ?PCP: [facility]  ? ? ?Brief Narrative:  ?Dylan Ross is a 46 year old male with past medical history significant for HTN, DM2, tobacco use disorder, GERD who initially presented to [facility] ED on 3/30 complaining of left facial numbness.  Onset of symptoms 3 AM on 04/02/21.  He first noticed numbness in his tongue, then moved to his gums, lips, cheek and now his left side of his face, left neck and left upper extremity.  He first noted symptom onset occurring with his blood pressure 280/144.  He reports mostly compliant with his home medications but occasionally skips them for about a week at a time; and endorses no medications for the last 2 days.  Patient denies headache, no difficulty eating/drinking, no visual changes, no slurred speech, no focal weakness.  He initially presented to hospital in [place] on 29 March, they requested admission but he declined as they did not have an answer for his symptoms per his report. ? ?Patient currently endorses smoking a pack a day, drinks alcohol about a sixpack 2 times per week, and intermittent uses of marijuana. ? ?In the ED, temperature 97.9 ?F, HR 84, RR 20, BP 139/96, SPO2 95% on room air.  Sodium 132, potassium 3.4, chloride 97, CO2 23, glucose 190, BUN 9, creatinine 0.88.  AST 103, ALT 87.  WBC 10.5, hemoglobin 16.6, platelets 308.  MR brain without contrast with acute infarct right thalamus, subacute to late subacute infarcts bilateral corona radiata concerning for embolic etiology.  MRA head/neck with multifocal irregularity right A2, right MCA branches, bilateral P2 segments which may indicate multifocal narrowing but also could be artifactual, no hemodynamically significant stenosis in the neck.  Neurology was consulted.  Patient was transferred to [facility] for further evaluation of acute stroke.  ? ?  ?Assessment and Plan: ?* CVA (cerebral  vascular accident) (HCC) ?Patient presenting with left-sided facial numbness, paresthesias left upper extremity.  Onset 3 AM on 04/02/2021; initially seen at [facility] but left after admission was recommended.  MR head with acute infarct right thalamus and subacute infarcts of the bilateral corona radiata; concerning for possible embolic etiology.  LDL 80, triglycerides 300.  Hemoglobin A1c 11.0.  TTE with LVEF 60 to 65%, no regional wall motion normalities, trivial MR, IVC normal in size, cannot exclude a small PFO.  Seen by PT/OT/SLP with no needs identified. ?--Neurology following, appreciate assistance ?--DAPT Aspirin 81 mg p.o. daily, Plavix 75 mg p.o. daily x 3 weeks; followed by aspirin alone ?--Crestor 20 mg p.o. daily ?--Continue to monitor on telemetry; currently in NSR; may need outpatient monitor ?--Will need TEE per neurology ? ?HTN (hypertension) ?Home medication regimen includes amlodipine 10 mg p.o. daily, atenolol 100 mg p.o. daily, hydrochlorothiazide 25 mg p.o. daily, lisinopril 40 mg p.o. daily, spironolactone 50 mg p.o. daily. ?--restart amlodipine 10mg  PO daily and lisinopril 40mg  PO daily today ?--continue to monitor BP  ? ?DMII (diabetes mellitus, type 2) (HCC) ?Hemoglobin A1c 11.0, poorly controlled.  Home regimen includes Lantus 42 units subcutaneously daily, glipizide 10 mg p.o. twice daily, Victoza 2.1 mg Erma daily, metformin 1000 mg p.o. twice daily. ?--Diabetic educator and Dietitian following ?--Holding oral hypoglycemics, Victoza while inpatient ?--Levemir 40 units subcutaneously daily ?--Moderate SSI for coverage ?--CBGs qAC/HS ? ?Hypokalemia ?Potassium 3.4 this morning, will replete. ? ? ?GERD (gastroesophageal reflux disease) ?--Protonix 40 mg p.o. daily ? ?Tobacco use disorder ?Patient continues  to endorse 1 pack/day.  Counseled on need for complete cessation. ?--Bupropion 150 mg p.o. twice daily ?--Nicotine patch ?--Nicotine gum ? ?Hypomagnesemia ?Magnesium 1.4, will  replete. ? ?OSA (obstructive sleep apnea) ?Currently undergoing sleep study per neurology, positive study overnight and pending titration study results and randomization ? ?Hypertriglyceridemia ?Triglycerides 300. ?--Start fenofibrate 54 mg p.o. daily ? ?Obesity (BMI 30-39.9) ?Discussed with patient needs for aggressive lifestyle changes/weight loss as this complicates all facets of care.  Outpatient follow-up with PCP.  ? ? ? ? ?DVT prophylaxis: heparin injection 5,000 Units Start: 04/03/21 2230 ?SCD's Start: 04/03/21 2141 ? ?  Code Status: Full Code ?Family Communication: No family present at bedside this morning ? ?Disposition Plan:  ?Level of care: Telemetry Medical ?Status is: Observation ?The patient remains OBS appropriate and will d/c before 2 midnights. ?  ? ?Consultants:  ?Neurology ? ?Procedures:  ?TTE: Pending ? ?Antimicrobials:  ?None ? ? ?Subjective: ?Patient seen examined bedside, resting comfortably.  No specific complaints this morning.  Awaiting for results of titration study overnight and randomization per neurology for OSA.  Will need TEE given possible small PFO on TTE, likely tomorrow.  No other specific complaints or concerns at this time.  Denies headache, no visual changes, no chest pain, no palpitations, no shortness of breath, no abdominal pain, no focal weakness, no cough/congestion, no fever/chills/night sweats, no nausea/vomiting/diarrhea.  No acute events overnight per nursing staff. ? ?Objective: ?Vitals:  ? 04/05/21 1900 04/05/21 2349 04/06/21 0403 04/06/21 0802  ?BP: (!) 148/100 (!) 165/116 (!) 154/112 (!) 175/109  ?Pulse: 79 79 78 87  ?Resp: (!) 22 (!) 21 20 19   ?Temp: 98.3 ?F (36.8 ?C) 97.7 ?F (36.5 ?C) 98.6 ?F (37 ?C) 99 ?F (37.2 ?C)  ?TempSrc: Oral Oral Oral Oral  ?SpO2: 99% 93% 97% 100%  ?Weight:      ?Height:      ? ? ?Intake/Output Summary (Last 24 hours) at 04/06/2021 1016 ?Last data filed at 04/06/2021 0755 ?Gross per 24 hour  ?Intake 1063 ml  ?Output --  ?Net 1063 ml  ? ?Filed  Weights  ? 04/03/21 1336  ?Weight: 120.2 kg  ? ? ?Examination: ? ?Physical Exam: ?GEN: NAD, alert and oriented x 3, obese ?HEENT: NCAT, PERRL, EOMI, sclera clear, MMM ?PULM: CTAB w/o wheezes/crackles, normal respiratory effort ?CV: RRR w/o M/G/R ?GI: abd soft, NTND, NABS, no R/G/M ?MSK: no peripheral edema, moves all extremities independently, strength globally intact ?NEURO: CN II-XII intact, no focal deficits, sensation to light touch decreased left face and left upper extremity ?PSYCH: normal mood/affect ?Integumentary: dry/intact, no rashes or wounds ? ? ? ?Data Reviewed: I have personally reviewed following labs and imaging studies ? ?CBC: ?Recent Labs  ?Lab 04/03/21 ?1538  ?WBC 10.5  ?NEUTROABS 6.6  ?HGB 16.6  ?HCT 46.0  ?MCV 90.9  ?PLT 308  ? ?Basic Metabolic Panel: ?Recent Labs  ?Lab 04/03/21 ?1538 04/05/21 ?0232 04/06/21 ?0237  ?NA 132* 135 136  ?K 3.4* 3.3* 3.5  ?CL 97* 101 104  ?CO2 23 25 24   ?GLUCOSE 190* 189* 153*  ?BUN 9 12 12   ?CREATININE 0.88 0.82 0.76  ?CALCIUM 9.0 9.1 8.9  ?MG  --  1.5* 1.4*  ? ?GFR: ?Estimated Creatinine Clearance: 155.5 mL/min (by C-G formula based on SCr of 0.76 mg/dL). ?Liver Function Tests: ?Recent Labs  ?Lab 04/03/21 ?1538 04/05/21 ?0232 04/06/21 ?0237  ?AST 103* 111* 96*  ?ALT 87* 111* 110*  ?ALKPHOS 85 75 68  ?BILITOT 0.9 0.5 0.6  ?PROT 6.8  6.0* 5.9*  ?ALBUMIN 3.4* 2.9* 3.0*  ? ?No results for input(s): LIPASE, AMYLASE in the last 168 hours. ?No results for input(s): AMMONIA in the last 168 hours. ?Coagulation Profile: ?No results for input(s): INR, PROTIME in the last 168 hours. ?Cardiac Enzymes: ?No results for input(s): CKTOTAL, CKMB, CKMBINDEX, TROPONINI in the last 168 hours. ?BNP (last 3 results) ?No results for input(s): PROBNP in the last 8760 hours. ?HbA1C: ?Recent Labs  ?  04/04/21 ?0251  ?HGBA1C 11.0*  ? ?CBG: ?Recent Labs  ?Lab 04/05/21 ?0609 04/05/21 ?1212 04/05/21 ?1607 04/05/21 ?2139 04/06/21 ?0618  ?GLUCAP 203* 272* 174* 170* 141*  ? ?Lipid Profile: ?Recent  Labs  ?  04/04/21 ?0251  ?CHOL 166  ?HDL 26*  ?LDLCALC 80  ?TRIG 300*  ?CHOLHDL 6.4  ? ?Thyroid Function Tests: ?No results for input(s): TSH, T4TOTAL, FREET4, T3FREE, THYROIDAB in the last 72 hours. ?Ane

## 2021-04-07 ENCOUNTER — Encounter (HOSPITAL_COMMUNITY)
Admission: EM | Disposition: A | Discharge status: Home / Self Care | Payer: Self-pay | Source: Home / Self Care | Attending: Student

## 2021-04-07 ENCOUNTER — Observation Stay (HOSPITAL_BASED_OUTPATIENT_CLINIC_OR_DEPARTMENT_OTHER): Payer: Medicaid Other | Admitting: Anesthesiology

## 2021-04-07 ENCOUNTER — Observation Stay (HOSPITAL_BASED_OUTPATIENT_CLINIC_OR_DEPARTMENT_OTHER): Payer: Medicaid Other

## 2021-04-07 ENCOUNTER — Observation Stay (HOSPITAL_COMMUNITY): Payer: Medicaid Other | Admitting: Anesthesiology

## 2021-04-07 ENCOUNTER — Encounter (HOSPITAL_COMMUNITY): Payer: Self-pay | Admitting: Family Medicine

## 2021-04-07 DIAGNOSIS — G4730 Sleep apnea, unspecified: Secondary | ICD-10-CM

## 2021-04-07 DIAGNOSIS — E1165 Type 2 diabetes mellitus with hyperglycemia: Secondary | ICD-10-CM | POA: Diagnosis not present

## 2021-04-07 DIAGNOSIS — I639 Cerebral infarction, unspecified: Secondary | ICD-10-CM

## 2021-04-07 DIAGNOSIS — I10 Essential (primary) hypertension: Secondary | ICD-10-CM

## 2021-04-07 DIAGNOSIS — E876 Hypokalemia: Secondary | ICD-10-CM | POA: Diagnosis not present

## 2021-04-07 DIAGNOSIS — I081 Rheumatic disorders of both mitral and tricuspid valves: Secondary | ICD-10-CM

## 2021-04-07 HISTORY — PX: TEE WITHOUT CARDIOVERSION: SHX5443

## 2021-04-07 LAB — ANCA TITERS
Atypical P-ANCA titer: <1:20 {titer}
C-ANCA: <1:20 {titer}
P-ANCA: <1:20 {titer}

## 2021-04-07 LAB — GLUCOSE, CAPILLARY
Glucose-Capillary: 120 mg/dL — ABNORMAL HIGH (ref 70–99)
Glucose-Capillary: 95 mg/dL (ref 70–99)

## 2021-04-07 SURGERY — ECHOCARDIOGRAM, TRANSESOPHAGEAL
Anesthesia: Monitor Anesthesia Care

## 2021-04-07 MED ORDER — NICOTINE 21 MG/24HR TD PT24: 21.0000 mg | MEDICATED_PATCH | Freq: Every day | TRANSDERMAL | 0 refills | Status: AC

## 2021-04-07 MED ORDER — PROPOFOL 500 MG/50ML IV EMUL
INTRAVENOUS | Status: DC | PRN
  Administered 2021-04-07: 150 ug/kg/min via INTRAVENOUS

## 2021-04-07 MED ORDER — ROSUVASTATIN CALCIUM 20 MG PO TABS
20.0000 mg | ORAL_TABLET | Freq: Every day | ORAL | 2 refills | Status: AC
Start: 2021-04-07 — End: 2021-11-19

## 2021-04-07 MED ORDER — SODIUM CHLORIDE 0.9 % IV SOLN: INTRAVENOUS | Status: DC

## 2021-04-07 MED ORDER — LISINOPRIL 40 MG PO TABS
40.0000 mg | ORAL_TABLET | Freq: Every day | ORAL | 2 refills | Status: AC
Start: 2021-04-07 — End: 2021-11-19

## 2021-04-07 MED ORDER — SPIRONOLACTONE 50 MG PO TABS
50.0000 mg | ORAL_TABLET | Freq: Every day | ORAL | 2 refills | Status: AC
Start: 2021-04-07 — End: 2021-07-06

## 2021-04-07 MED ORDER — ATENOLOL 100 MG PO TABS
100.0000 mg | ORAL_TABLET | Freq: Every day | ORAL | 2 refills | Status: AC
Start: 2021-04-07 — End: 2021-11-19

## 2021-04-07 MED ORDER — LIDOCAINE HCL (CARDIAC) PF 100 MG/5ML IV SOSY
PREFILLED_SYRINGE | INTRAVENOUS | Status: DC | PRN
Start: 2021-04-07 — End: 2021-04-07
  Administered 2021-04-07: 60 mg via INTRAVENOUS

## 2021-04-07 MED ORDER — ASPIRIN 81 MG PO TBEC
81.0000 mg | DELAYED_RELEASE_TABLET | Freq: Every day | ORAL | 2 refills | Status: AC
Start: 2021-04-07 — End: 2021-07-06

## 2021-04-07 MED ORDER — METFORMIN HCL 1000 MG PO TABS: 1000.0000 mg | ORAL_TABLET | Freq: Two times a day (BID) | ORAL | 2 refills | Status: AC

## 2021-04-07 MED ORDER — BUTAMBEN-TETRACAINE-BENZOCAINE 2-2-14 % EX AERO
INHALATION_SPRAY | CUTANEOUS | Status: DC | PRN
  Administered 2021-04-07: 2 via TOPICAL

## 2021-04-07 MED ORDER — GLIPIZIDE 10 MG PO TABS: 10.0000 mg | ORAL_TABLET | Freq: Two times a day (BID) | ORAL | 2 refills | Status: AC

## 2021-04-07 MED ORDER — FENOFIBRATE 54 MG PO TABS: 54.0000 mg | ORAL_TABLET | Freq: Every day | ORAL | 2 refills | Status: AC

## 2021-04-07 MED ORDER — AMLODIPINE BESYLATE 10 MG PO TABS: 10.0000 mg | ORAL_TABLET | Freq: Every day | ORAL | 2 refills | Status: AC

## 2021-04-07 MED ORDER — CLOPIDOGREL BISULFATE 75 MG PO TABS
75.0000 mg | ORAL_TABLET | Freq: Every day | ORAL | 0 refills | Status: AC
Start: 2021-04-07 — End: 2021-04-28

## 2021-04-07 MED ORDER — PROPOFOL 10 MG/ML IV BOLUS
INTRAVENOUS | Status: DC | PRN
Start: 2021-04-07 — End: 2021-04-07
  Administered 2021-04-07 (×4): 20 mg via INTRAVENOUS

## 2021-04-07 MED ORDER — NICOTINE POLACRILEX 2 MG MT GUM: 2.0000 mg | CHEWING_GUM | OROMUCOSAL | 2 refills | Status: AC | PRN

## 2021-04-07 MED ORDER — INSULIN GLARGINE 100 UNIT/ML ~~LOC~~ SOLN: 42.0000 [IU] | Freq: Every day | SUBCUTANEOUS | 2 refills | Status: AC

## 2021-04-07 MED ORDER — HYDROCHLOROTHIAZIDE 12.5 MG PO CAPS
25.0000 mg | ORAL_CAPSULE | Freq: Every day | ORAL | 2 refills | Status: AC
Start: 2021-04-07 — End: 2021-07-06

## 2021-04-07 NOTE — Progress Notes (Signed)
STROKE TEAM PROGRESS NOTE  ?INTERVAL HISTORY ?Patient is sitting up in bed comfortably.  He still has some residual numbness.  He did participate in sleep smart study and qualified and was randomized to the CPAP treatment arm.  He had TEE today which showed ejection fraction of 60 to 65% with normal LV function.  No right-to-left shunt or intra-atrial clot was noted. ? ?Vitals:  ? 04/07/21 1144 04/07/21 1155 04/07/21 1205 04/07/21 1243  ?BP: (!) 93/49 108/83 (!) 130/99 (!) 161/103  ?Pulse: 83 78 85 85  ?Resp: 12 11 17    ?Temp: (!) 97.1 ?F (36.2 ?C)   97.7 ?F (36.5 ?C)  ?TempSrc: Temporal   Oral  ?SpO2: 95% 96% 99% 100%  ?Weight:      ?Height:      ? ?CBC:  ?Recent Labs  ?Lab 04/03/21 ?1538  ?WBC 10.5  ?NEUTROABS 6.6  ?HGB 16.6  ?HCT 46.0  ?MCV 90.9  ?PLT 308  ? ?Basic Metabolic Panel:  ?Recent Labs  ?Lab 04/05/21 ?0232 04/06/21 ?0237  ?NA 135 136  ?K 3.3* 3.5  ?CL 101 104  ?CO2 25 24  ?GLUCOSE 189* 153*  ?BUN 12 12  ?CREATININE 0.82 0.76  ?CALCIUM 9.1 8.9  ?MG 1.5* 1.4*  ? ?Lipid Panel:  ?Recent Labs  ?Lab 04/04/21 ?0251  ?CHOL 166  ?TRIG 300*  ?HDL 26*  ?CHOLHDL 6.4  ?VLDL 60*  ?LDLCALC 80  ? ?HgbA1c:  ?Recent Labs  ?Lab 04/04/21 ?0251  ?HGBA1C 11.0*  ? ?Urine Drug Screen:  ?No results for input(s): LABOPIA, COCAINSCRNUR, LABBENZ, AMPHETMU, THCU, LABBARB in the last 168 hours. ?Alcohol Level No results for input(s): ETH in the last 168 hours. ? ?IMAGING past 24 hours ?ECHO TEE ? ?Result Date: 04/07/2021 ?   TRANSESOPHOGEAL ECHO REPORT   Patient Name:   Dylan Ross Date of Exam: 04/07/2021 Medical Rec #:  [id number]    Height:       71.0 in Accession #:    [id number]   Weight:       265.0 lb Date of Birth:  [birth year]    BSA:          2.376 m? Patient Age:    45 years     BP:           165/110 mmHg Patient Gender: M            HR:           94 bpm. Exam Location:  Inpatient Procedure: Transesophageal Echo, Color Doppler and Cardiac Doppler Indications:     Stroke  History:         Patient has prior history of  Echocardiogram examinations, most                  recent 04/04/2021. Risk Factors:Diabetes and Hypertension.  Sonographer:     Eulah Pont RDCS Referring Phys:  [id number] Roe Rutherford [facility] Diagnosing Phys: [name] MD PROCEDURE: After discussion of the risks and benefits of a TEE, an informed consent was obtained from the patient. The transesophogeal probe was passed without difficulty through the esophogus of the patient. Sedation performed by different physician. The patient was monitored while under deep sedation. Anesthestetic sedation was provided intravenously by Anesthesiology: 647.95mg  of Propofol, 60mg  of Lidocaine. The patient's vital signs; including heart rate, blood pressure, and oxygen saturation; remained stable throughout the procedure. The patient developed no complications during the procedure. IMPRESSIONS  1. No PFO.  2. Left ventricular ejection fraction, by estimation, is  60 to 65%. The left ventricle has normal function. The left ventricle has no regional wall motion abnormalities.  3. Right ventricular systolic function is normal. The right ventricular size is normal.  4. No left atrial/left atrial appendage thrombus was detected.  5. The mitral valve is normal in structure. Trivial mitral valve regurgitation. No evidence of mitral stenosis.  6. The aortic valve is normal in structure. Aortic valve regurgitation is not visualized. No aortic stenosis is present.  7. The inferior vena cava is normal in size with greater than 50% respiratory variability, suggesting right atrial pressure of 3 mmHg. Conclusion(s)/Recommendation(s): Normal biventricular function without evidence of hemodynamically significant valvular heart disease. No LA/LAA thrombus identified. No PFO identified. No intracardiac source of embolism detected on this on this transesophageal echocardiogram. FINDINGS  Left Ventricle: Left ventricular ejection fraction, by estimation, is 60 to 65%. The left ventricle has normal  function. The left ventricle has no regional wall motion abnormalities. The left ventricular internal cavity size was normal in size. There is  no left ventricular hypertrophy. Right Ventricle: The right ventricular size is normal. No increase in right ventricular wall thickness. Right ventricular systolic function is normal. Left Atrium: Left atrial size was normal in size. No left atrial/left atrial appendage thrombus was detected. Right Atrium: Right atrial size was normal in size. Pericardium: There is no evidence of pericardial effusion. Mitral Valve: The mitral valve is normal in structure. Trivial mitral valve regurgitation. No evidence of mitral valve stenosis. Tricuspid Valve: The tricuspid valve is normal in structure. Tricuspid valve regurgitation is trivial. No evidence of tricuspid stenosis. Aortic Valve: The aortic valve is normal in structure. Aortic valve regurgitation is not visualized. No aortic stenosis is present. Pulmonic Valve: The pulmonic valve was normal in structure. Pulmonic valve regurgitation is not visualized. No evidence of pulmonic stenosis. Aorta: The aortic root is normal in size and structure. Venous: The inferior vena cava is normal in size with greater than 50% respiratory variability, suggesting right atrial pressure of 3 mmHg. IAS/Shunts: No atrial level shunt detected by color flow Doppler. There is no evidence of a patent foramen ovale. Candee Furbish MD Electronically signed by Candee Furbish MD Signature Date/Time: 04/07/2021/12:13:14 PM    Final    ? ?PHYSICAL EXAM ?Constitutional: Obese middle-age male in no apparent distress. ?Psych: Affect appropriate to situation ?Eyes: No scleral injection ?Respiratory: Effort normal, non-labored breathing ?Gait and Station: Deferred ? ?Neuro: ?Mental Status: ?Patient is awake, alert, oriented to person, place, month, year, and situation. ?Speech is clear, coherent, spontaneous  ?Word repetition, naming intact ?Comprehension intact and able  to follow commands ? ?Cranial Nerves: ?II: Visual Fields are full. Pupils are equal, round, and reactive to light.   ?III,IV, VI: EOMI without ptosis or diploplia.  ?V: Facial sensation is symmetric to temperature ?VII: Facial movement is symmetric resting and smiling ?VIII: Hearing is intact to voice ?X: Palate elevates symmetrically ?XI: Shoulder shrug and chin turning is symmetric ?XII: Tongue protrudes midline without atrophy or fasciculations ?Motor ?Tone is normal. Bulk is normal. 5/5 strength was present in all four extremities and pronator drift was absent.  ?Sensory: ?Sensation is intact and temperature in the arms and legs. Left sided paresthesia in face, arm and leg ?Cerebellar: ?No ataxia ? ?NIHSS 1  Premorbid MRS 0 ? ?ASSESSMENT/PLAN ?Mr. Dylan Ross is a 46 y.o. male with history of DM2, HTN, new HLD, tobacco abuse, EtOH abuse, remove cannabis abuse presenting (04/03/2021) with L facial and arm numbness x2 days prior  to admission.  ? ?He is eligible for sleep smart study. He is interested in enrolling. CPAP autotitration tonight then will get randomized tomorrow by sleep coordinator. TEE on Monday. He needs to be at work by Wednesday and told him he can likely go home Monday after his TEE. ? ? 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 Multifocal irregularity in the right A2, right MCA branches, and bilateral P2 segments ?MRA: No evidence of dissection, occlusion, or hemodynamically significant stenosis (greater than 50%) in bilateral carotids and vertebral arteries. No LVOs ?2D Echo: EF 60 to 65%.   ?TEE no right-to-left shunt or LAA clot.   ?LDL 80  (04/04/2021) ?HgbA1c 11.0 (04/04/2021)  ?VTE prophylaxis - ,  (heparin sq),  ?Diet: Carb modified, Cardiac ?No antithrombic prior to  admission, now on aspirin 81 mg daily and clopidogrel 75 mg daily for 3 weeks, then aspirin 81 mg daily monotherapy ?Therapy recommendations: No PT follow up, No OT follow up, No SLP follow up, None recommended by PT Pendi

## 2021-04-07 NOTE — Plan of Care (Signed)
?Problem: Education: ?Goal: Knowledge of General Education information will improve ?Description: Including pain rating scale, medication(s)/side effects and non-pharmacologic comfort measures ?04/07/2021 1320 by Geremiah Fussell, Quitman Livings, RN ?Outcome: Adequate for Discharge ?04/07/2021 0751 by Beryle Lathe, RN ?Outcome: Progressing ?  ?Problem: Health Behavior/Discharge Planning: ?Goal: Ability to manage health-related needs will improve ?04/07/2021 1320 by Kathya Wilz, Quitman Livings, RN ?Outcome: Adequate for Discharge ?04/07/2021 0751 by Beryle Lathe, RN ?Outcome: Progressing ?  ?Problem: Clinical Measurements: ?Goal: Ability to maintain clinical measurements within normal limits will improve ?04/07/2021 1320 by Maretta Overdorf, Quitman Livings, RN ?Outcome: Adequate for Discharge ?04/07/2021 0751 by Beryle Lathe, RN ?Outcome: Progressing ?Goal: Will remain free from infection ?04/07/2021 1320 by Mikaili Flippin, Quitman Livings, RN ?Outcome: Adequate for Discharge ?04/07/2021 0751 by Beryle Lathe, RN ?Outcome: Progressing ?Goal: Diagnostic test results will improve ?04/07/2021 1320 by Oliver Neuwirth, Quitman Livings, RN ?Outcome: Adequate for Discharge ?04/07/2021 0751 by Beryle Lathe, RN ?Outcome: Progressing ?Goal: Respiratory complications will improve ?04/07/2021 1320 by Raymon Schlarb, Quitman Livings, RN ?Outcome: Adequate for Discharge ?04/07/2021 0751 by Beryle Lathe, RN ?Outcome: Progressing ?Goal: Cardiovascular complication will be avoided ?04/07/2021 1320 by Kharon Hixon, Quitman Livings, RN ?Outcome: Adequate for Discharge ?04/07/2021 0751 by Beryle Lathe, RN ?Outcome: Progressing ?  ?Problem: Activity: ?Goal: Risk for activity intolerance will decrease ?04/07/2021 1320 by Wilfred Siverson, Quitman Livings, RN ?Outcome: Adequate for Discharge ?04/07/2021 0751 by Beryle Lathe, RN ?Outcome: Progressing ?  ?Problem: Nutrition: ?Goal: Adequate nutrition will be maintained ?04/07/2021 1320 by Jen Benedict, Quitman Livings, RN ?Outcome: Adequate for Discharge ?04/07/2021 0751 by Beryle Lathe, RN ?Outcome: Progressing ?  ?Problem: Coping: ?Goal: Level of anxiety will decrease ?04/07/2021 1320 by Tijana Walder, Quitman Livings, RN ?Outcome: Adequate for Discharge ?04/07/2021 0751 by Beryle Lathe, RN ?Outcome: Progressing ?  ?Problem: Elimination: ?Goal: Will not experience complications related to bowel motility ?04/07/2021 1320 by Eimi Viney, Quitman Livings, RN ?Outcome: Adequate for Discharge ?04/07/2021 0751 by Beryle Lathe, RN ?Outcome: Progressing ?Goal: Will not experience complications related to urinary retention ?04/07/2021 1320 by Monya Kozakiewicz, Quitman Livings, RN ?Outcome: Adequate for Discharge ?04/07/2021 0751 by Beryle Lathe, RN ?Outcome: Progressing ?  ?Problem: Pain Managment: ?Goal: General experience of comfort will improve ?04/07/2021 1320 by Aleathia Purdy, Quitman Livings, RN ?Outcome: Adequate for Discharge ?04/07/2021 0751 by Beryle Lathe, RN ?Outcome: Progressing ?  ?Problem: Safety: ?Goal: Ability to remain free from injury will improve ?04/07/2021 1320 by Maiko Salais, Quitman Livings, RN ?Outcome: Adequate for Discharge ?04/07/2021 0751 by Beryle Lathe, RN ?Outcome: Progressing ?  ?Problem: Skin Integrity: ?Goal: Risk for impaired skin integrity will decrease ?04/07/2021 1320 by Raynelle Fujikawa, Quitman Livings, RN ?Outcome: Adequate for Discharge ?04/07/2021 0751 by Beryle Lathe, RN ?Outcome: Progressing ?  ?Problem: Education: ?Goal: Knowledge of disease or condition will improve ?04/07/2021 1320 by Lovada Barwick, Quitman Livings, RN ?Outcome: Adequate for Discharge ?04/07/2021 0751 by Beryle Lathe, RN ?Outcome: Progressing ?Goal: Knowledge of secondary prevention will improve (SELECT ALL) ?04/07/2021 1320 by Katerin Negrete, Quitman Livings, RN ?Outcome: Adequate for Discharge ?04/07/2021 0751 by Beryle Lathe, RN ?Outcome: Progressing ?Goal: Knowledge of patient specific risk factors will improve (INDIVIDUALIZE FOR PATIENT) ?04/07/2021 1320 by Jurnie Garritano, Quitman Livings, RN ?Outcome: Adequate for Discharge ?04/07/2021 0751 by Beryle Lathe, RN ?Outcome:  Progressing ?  ?Problem: Coping: ?Goal: Will verbalize positive feelings about self ?04/07/2021 1320 by Clements Toro, Quitman Livings, RN ?Outcome: Adequate for Discharge ?04/07/2021 0751 by Beryle Lathe, RN ?Outcome: Progressing ?Goal: Will identify appropriate support needs ?04/07/2021 1320 by  Tametria Aho, Quitman Livings, RN ?Outcome: Adequate for Discharge ?04/07/2021 0751 by Beryle Lathe, RN ?Outcome: Progressing ?  ?

## 2021-04-07 NOTE — CV Procedure (Signed)
? ?  Transesophageal Echocardiogram ? ?Indications: Stroke ? ?Time out performed ? ?During this procedure the patient was administered propofol under anesthesiology supervision to achieve and maintain moderate sedation.  The patient's heart rate, blood pressure, and oxygen saturation are monitored continuously during the procedure.  ? ?Findings: ? ?Left Ventricle: Normal EF 65% ? ?Mitral Valve: Trace MR ? ?Aortic Valve: Normal ? ?Tricuspid Valve: Trace TR ? ?Left Atrium: Normal, no left atrial appendage thrombus ? ?Right Atrium: Normal ? ?Intraatrial septum: Normal, No PFO or shunt ? ?IMPRESSION: No intracardiac source of stroke ? ?[name], MD ?  ?

## 2021-04-07 NOTE — Progress Notes (Signed)
? ? ?  [facility] has been requested to perform a transesophageal echocardiogram on Newell Giovanni for stroke.  After careful review of history and examination, the risks and benefits of transesophageal echocardiogram have been explained including risks of esophageal damage, perforation (1:10,000 risk), bleeding, pharyngeal hematoma as well as other potential complications associated with conscious sedation including aspiration, arrhythmia, respiratory failure and death. Alternatives to treatment were discussed, questions were answered. Patient is willing to proceed.  ? ?Marcelino Duster, PA  ?04/07/2021 10:29 AM  ? ?

## 2021-04-07 NOTE — TOC Transition Note (Signed)
Transition of Care (TOC) - CM/SW Discharge Note ? ? ?Patient Details  ?Name: Dylan Ross ?MRN: [id number] ?Date of Birth: 02/25/1975 ? ?Transition of Care (TOC) CM/SW Contact:  ?Pollie Friar, RN ?Phone Number: ?04/07/2021, 12:59 PM ? ? ?Clinical Narrative:    ?Patient discharging home with self care. No needs per TOC.  ?Pt has transportation home.  ? ? ?Final next level of care: Home/Self Care ?Barriers to Discharge: No Barriers Identified ? ? ?Patient Goals and CMS Choice ?  ?  ?  ? ?Discharge Placement ?  ?           ?  ?  ?  ?  ? ?Discharge Plan and Services ?  ?  ?           ?  ?  ?  ?  ?  ?  ?  ?  ?  ?  ? ?Social Determinants of Health (SDOH) Interventions ?  ? ? ?Readmission Risk Interventions ?   ? View : No data to display.  ?  ?  ?  ? ? ? ? ? ?

## 2021-04-07 NOTE — Anesthesia Preprocedure Evaluation (Signed)
Anesthesia Evaluation  ?Patient identified by MRN, date of birth, ID band ?Patient awake ? ? ? ?Reviewed: ?Allergy & Precautions, NPO status , Patient's Chart, lab work & pertinent test results ? ?Airway ?Mallampati: II ? ?TM Distance: >3 FB ?Neck ROM: Full ? ? ? Dental ?no notable dental hx. ? ?  ?Pulmonary ?sleep apnea , Current Smoker and Patient abstained from smoking.,  ?  ?Pulmonary exam normal ?breath sounds clear to auscultation ? ? ? ? ? ? Cardiovascular ?hypertension, Pt. on medications ?negative cardio ROS ?Normal cardiovascular exam ?Rhythm:Regular Rate:Normal ? ? ?  ?Neuro/Psych ?CVA negative psych ROS  ? GI/Hepatic ?Neg liver ROS, GERD  ,  ?Endo/Other  ?negative endocrine ROSdiabetes, Type 2 ? Renal/GU ?Renal InsufficiencyRenal disease  ?negative genitourinary ?  ?Musculoskeletal ?negative musculoskeletal ROS ?(+)  ? Abdominal ?(+) + obese,   ?Peds ?negative pediatric ROS ?(+)  Hematology ?negative hematology ROS ?(+)   ?Anesthesia Other Findings ? ? Reproductive/Obstetrics ?negative OB ROS ? ?  ? ? ? ? ? ? ? ? ? ? ? ? ? ?  ?  ? ? ? ? ? ? ? ? ?Anesthesia Physical ?Anesthesia Plan ? ?ASA: 3 ? ?Anesthesia Plan: MAC  ? ?Post-op Pain Management:   ? ?Induction: Intravenous ? ?PONV Risk Score and Plan: Treatment may vary due to age or medical condition ? ?Airway Management Planned: Nasal Cannula ? ?Additional Equipment:  ? ?Intra-op Plan:  ? ?Post-operative Plan:  ? ?Informed Consent: I have reviewed the patients History and Physical, chart, labs and discussed the procedure including the risks, benefits and alternatives for the proposed anesthesia with the patient or authorized representative who has indicated his/her understanding and acceptance.  ? ? ? ?Dental advisory given ? ?Plan Discussed with: CRNA ? ?Anesthesia Plan Comments:   ? ? ? ? ? ? ?Anesthesia Quick Evaluation ? ?

## 2021-04-07 NOTE — Anesthesia Postprocedure Evaluation (Signed)
Anesthesia Post Note ? ?Patient: Dylan Ross ? ?Procedure(s) Performed: TRANSESOPHAGEAL ECHOCARDIOGRAM (TEE) ? ?  ? ?Patient location during evaluation: PACU ?Anesthesia Type: MAC ?Level of consciousness: awake and alert ?Pain management: pain level controlled ?Vital Signs Assessment: post-procedure vital signs reviewed and stable ?Respiratory status: spontaneous breathing, nonlabored ventilation and respiratory function stable ?Cardiovascular status: blood pressure returned to baseline and stable ?Postop Assessment: no apparent nausea or vomiting ?Anesthetic complications: no ? ? ?No notable events documented. ? ?Last Vitals:  ?Vitals:  ? 04/07/21 1205 04/07/21 1243  ?BP: (!) 130/99 (!) 161/103  ?Pulse: 85 85  ?Resp: 17   ?Temp:  36.5 ?C  ?SpO2: 99% 100%  ?  ?Last Pain:  ?Vitals:  ? 04/07/21 1243  ?TempSrc: Oral  ?PainSc:   ? ? ?  ?  ?  ?  ?  ?  ? ?Lynda Rainwater ? ? ? ? ?

## 2021-04-07 NOTE — Plan of Care (Signed)
?  Problem: Education: ?Goal: Knowledge of General Education information will improve ?Description: Including pain rating scale, medication(s)/side effects and non-pharmacologic comfort measures ?Outcome: Progressing ?  ?Problem: Health Behavior/Discharge Planning: ?Goal: Ability to manage health-related needs will improve ?Outcome: Progressing ?  ?Problem: Clinical Measurements: ?Goal: Ability to maintain clinical measurements within normal limits will improve ?Outcome: Progressing ?Goal: Will remain free from infection ?Outcome: Progressing ?Goal: Diagnostic test results will improve ?Outcome: Progressing ?Goal: Respiratory complications will improve ?Outcome: Progressing ?Goal: Cardiovascular complication will be avoided ?Outcome: Progressing ?  ?Problem: Activity: ?Goal: Risk for activity intolerance will decrease ?Outcome: Progressing ?  ?Problem: Nutrition: ?Goal: Adequate nutrition will be maintained ?Outcome: Progressing ?  ?Problem: Coping: ?Goal: Level of anxiety will decrease ?Outcome: Progressing ?  ?Problem: Elimination: ?Goal: Will not experience complications related to bowel motility ?Outcome: Progressing ?Goal: Will not experience complications related to urinary retention ?Outcome: Progressing ?  ?Problem: Pain Managment: ?Goal: General experience of comfort will improve ?Outcome: Progressing ?  ?Problem: Safety: ?Goal: Ability to remain free from injury will improve ?Outcome: Progressing ?  ?Problem: Skin Integrity: ?Goal: Risk for impaired skin integrity will decrease ?Outcome: Progressing ?  ?Problem: Education: ?Goal: Knowledge of disease or condition will improve ?Outcome: Progressing ?Goal: Knowledge of secondary prevention will improve (SELECT ALL) ?Outcome: Progressing ?Goal: Knowledge of patient specific risk factors will improve (INDIVIDUALIZE FOR PATIENT) ?Outcome: Progressing ?  ?Problem: Coping: ?Goal: Will verbalize positive feelings about self ?Outcome: Progressing ?Goal: Will  identify appropriate support needs ?Outcome: Progressing ?  ?

## 2021-04-07 NOTE — Progress Notes (Signed)
Pt has Home CPAP. Will monitor ?

## 2021-04-07 NOTE — Discharge Summary (Signed)
?Physician Discharge Summary  ?Dylan Ross GBT:[id number] DOB: May 04, 1975 DOA: 04/03/2021 ? ?PCP: [facility] ? ?Admit date: 04/03/2021 ?Discharge date: 04/07/2021 ? ?Admitted From: Home ?Disposition: Home ? ?Recommendations for Outpatient Follow-up:  ?Follow up with PCP in 1-2 weeks ?Follow-up with neurology in 4 weeks; stroke follow-up ?He is continued surveillance of diabetes and hypertension ?Continue to encourage tobacco and alcohol cessation ? ?Home Health: No ?Equipment/Devices: CPAP machine ? ?Discharge Condition: Stable ?CODE STATUS: Full code ?Diet recommendation: Heart healthy/consistent carbohydrate diet ? ?History of present illness: ? ?Dylan Ross is a 46 year old male with past medical history significant for HTN, DM2, tobacco use disorder, GERD who initially presented to [facility] ED on 3/30 complaining of left facial numbness.  Onset of symptoms 3 AM on 04/02/21.  He first noticed numbness in his tongue, then moved to his gums, lips, cheek and now his left side of his face, left neck and left upper extremity.  He first noted symptom onset occurring with his blood pressure 280/144.  He reports mostly compliant with his home medications but occasionally skips them for about a week at a time; and endorses no medications for the last 2 days.  Patient denies headache, no difficulty eating/drinking, no visual changes, no slurred speech, no focal weakness.  He initially presented to hospital in [place] on 29 March, they requested admission but he declined as they did not have an answer for his symptoms per his report. ? ?Patient currently endorses smoking a pack a day, drinks alcohol about a sixpack 2 times per week, and intermittent uses of marijuana. ? ?In the ED, temperature 97.9 ?F, HR 84, RR 20, BP 139/96, SPO2 95% on room air.  Sodium 132, potassium 3.4, chloride 97, CO2 23, glucose 190, BUN 9, creatinine 0.88.  AST 103, ALT 87.  WBC 10.5, hemoglobin 16.6, platelets 308.  MR  brain without contrast with acute infarct right thalamus, subacute to late subacute infarcts bilateral corona radiata concerning for embolic etiology.  MRA head/neck with multifocal irregularity right A2, right MCA branches, bilateral P2 segments which may indicate multifocal narrowing but also could be artifactual, no hemodynamically significant stenosis in the neck.  Neurology was consulted.  Patient was transferred to [facility] for further evaluation of acute stroke. ? ?Hospital course: ? ?Assessment and Plan: ?* CVA (cerebral vascular accident) (HCC) ?Patient presenting with left-sided facial numbness, paresthesias left upper extremity.  Onset 3 AM on 04/02/2021; initially seen at [facility] but left after admission was recommended.  MR head with acute infarct right thalamus and subacute infarcts of the bilateral corona radiata; concerning for possible embolic etiology.  LDL 80, triglycerides 300.  Hemoglobin A1c 11.0.  TTE with LVEF 60 to 65%, no regional wall motion normalities, trivial MR, IVC normal in size, cannot exclude a small PFO.  Neurology was consulted and followed during hospital course.  Seen by PT/OT/SLP with no needs identified.  TEE with no intracardiac source of stroke, no PFO or intra-atrial shunt.  We will continue dual antiplatelet therapy with aspirin 81 mg p.o. daily, Plavix 75 mg p.o. daily for 3 weeks followed by aspirin alone.  Started on Crestor 20 mg p.o. daily.  Continue tobacco and alcohol cessation. ? ?HTN (hypertension) ?Home medication regimen includes amlodipine 10 mg p.o. daily, atenolol 100 mg p.o. daily, hydrochlorothiazide 25 mg p.o. daily, lisinopril 40 mg p.o. daily, spironolactone 50 mg p.o. daily. ? ?DMII (diabetes mellitus, type 2) (HCC) ?Hemoglobin A1c 11.0, poorly controlled.  Home regimen  includes Lantus 42 units subcutaneously daily, glipizide 10 mg p.o. twice daily, Victoza 2.1 mg  daily, metformin 1000 mg p.o. twice daily.  Outpatient follow-up  with PCP.  Consistent carb regular diet. ? ?Hypokalemia ?Repleted during hospitalization. ? ?GERD (gastroesophageal reflux disease) ?Patient denies any further reflux symptoms, will discontinue Protonix at this time. ? ?Tobacco use disorder ?Patient continues to endorse 1 pack/day.  Counseled on need for complete cessation. Bupropion 150 mg p.o. twice daily, nicotine patch, nicotine gum. ? ?Hypomagnesemia ?Repleted during hospitalization. ? ?OSA (obstructive sleep apnea) ?Neurology perform sleep study while inpatient which was positive.  Patient will be discharging with home CPAP unit. ? ?Hypertriglyceridemia ?Triglycerides 300. Started fenofibrate 54 mg p.o. daily ? ?Obesity (BMI 30-39.9) ?Discussed with patient needs for aggressive lifestyle changes/weight loss as this complicates all facets of care.  Outpatient follow-up with PCP.  ? ? ? ? ? ? ?Discharge Diagnoses:  ?Principal Problem: ?  CVA (cerebral vascular accident) (HCC) ?Active Problems: ?  HTN (hypertension) ?  DMII (diabetes mellitus, type 2) (HCC) ?  Hypokalemia ?  GERD (gastroesophageal reflux disease) ?  Tobacco use disorder ?  Obesity (BMI 30-39.9) ?  Hypertriglyceridemia ?  OSA (obstructive sleep apnea) ?  Hypomagnesemia ? ? ? ?Discharge Instructions ? ?Discharge Instructions   ? ? Call MD for:  difficulty breathing, headache or visual disturbances   Complete by: As directed ?  ? Call MD for:  extreme fatigue   Complete by: As directed ?  ? Call MD for:  persistant dizziness or light-headedness   Complete by: As directed ?  ? Call MD for:  persistant nausea and vomiting   Complete by: As directed ?  ? Call MD for:  severe uncontrolled pain   Complete by: As directed ?  ? Call MD for:  temperature >100.4   Complete by: As directed ?  ? Diet - low sodium heart healthy   Complete by: As directed ?  ? Increase activity slowly   Complete by: As directed ?  ? ?  ? ?Allergies as of 04/07/2021   ?No Known Allergies ?  ? ?  ?Medication List  ?  ? ?STOP taking  these medications   ? ?pantoprazole 40 MG tablet ?Commonly known as: PROTONIX ?  ? ?  ? ?TAKE these medications   ? ?amLODipine 10 MG tablet ?Commonly known as: NORVASC ?Take 1 tablet (10 mg total) by mouth daily. ?What changed: medication strength ?  ?aspirin 81 MG EC tablet ?Take 1 tablet (81 mg total) by mouth daily. Swallow whole. ?  ?atenolol 100 MG tablet ?Commonly known as: TENORMIN ?Take 1 tablet (100 mg total) by mouth daily. ?  ?buPROPion 150 MG 12 hr tablet ?Commonly known as: ZYBAN ?Take 150 mg by mouth 2 (two) times daily. ?  ?clopidogrel 75 MG tablet ?Commonly known as: PLAVIX ?Take 1 tablet (75 mg total) by mouth daily for 21 days. ?  ?fenofibrate 54 MG tablet ?Take 1 tablet (54 mg total) by mouth daily. ?  ?gabapentin 600 MG tablet ?Commonly known as: NEURONTIN ?Take 600 mg by mouth 3 (three) times daily. ?  ?glipiZIDE 10 MG tablet ?Commonly known as: GLUCOTROL ?Take 1 tablet (10 mg total) by mouth 2 (two) times daily before a meal. ?  ?hydrochlorothiazide 12.5 MG capsule ?Commonly known as: MICROZIDE ?Take 2 capsules (25 mg total) by mouth daily. ?  ?insulin glargine 100 UNIT/ML injection ?Commonly known as: LANTUS ?Inject 0.42 mLs (42 Units total) into the skin daily. ?What  changed: when to take this ?  ?liraglutide 18 MG/3ML Sopn ?Commonly known as: VICTOZA ?Inject 2.1 mg into the skin daily. ?  ?lisinopril 40 MG tablet ?Commonly known as: ZESTRIL ?Take 1 tablet (40 mg total) by mouth daily. ?  ?metFORMIN 1000 MG tablet ?Commonly known as: GLUCOPHAGE ?Take 1 tablet (1,000 mg total) by mouth 2 (two) times daily. ?  ?nicotine 21 mg/24hr patch ?Commonly known as: NICODERM CQ - dosed in mg/24 hours ?Place 1 patch (21 mg total) onto the skin daily for 14 days. ?  ?nicotine polacrilex 2 MG gum ?Commonly known as: NICORETTE ?Take 1 each (2 mg total) by mouth as needed for smoking cessation. ?  ?rosuvastatin 20 MG tablet ?Commonly known as: CRESTOR ?Take 1 tablet (20 mg total) by mouth at bedtime. ?   ?spironolactone 50 MG tablet ?Commonly known as: ALDACTONE ?Take 1 tablet (50 mg total) by mouth daily. ?  ? ?  ? ? Follow-up Information   ? ? [facility] Schedule an appointm

## 2021-04-07 NOTE — Progress Notes (Signed)
Order to discharge pt home.  Discharge instructions/AVS given to patient and reviewed - education provided as needed.  Medication prescriptions given to pt. Pt advised to call PCP and/or come back to the hospital if there are any problems. Pt verbalized understanding.    

## 2021-04-07 NOTE — Progress Notes (Signed)
?  Echocardiogram ?Echocardiogram Transesophageal has been performed. ? ?Augustine Radar ?04/07/2021, 11:58 AM ?

## 2021-04-07 NOTE — Transfer of Care (Signed)
Immediate Anesthesia Transfer of Care Note ? ?Patient: Dylan Ross ? ?Procedure(s) Performed: TRANSESOPHAGEAL ECHOCARDIOGRAM (TEE) ? ?Patient Location: Endoscopy Unit ? ?Anesthesia Type:MAC ? ?Level of Consciousness: awake ? ?Airway & Oxygen Therapy: Patient Spontanous Breathing and Patient connected to nasal cannula oxygen ? ?Post-op Assessment: Report given to RN and Post -op Vital signs reviewed and stable ? ?Post vital signs: Reviewed and stable ? ?Last Vitals:  ?Vitals Value Taken Time  ?BP    ?Temp    ?Pulse 82 04/07/21 1144  ?Resp 13 04/07/21 1144  ?SpO2 95 % 04/07/21 1144  ?Vitals shown include unvalidated device data. ? ?Last Pain:  ?Vitals:  ? 04/07/21 1045  ?TempSrc: Temporal  ?PainSc: 0-No pain  ?   ? ?  ? ?Complications: No notable events documented. ?

## 2021-04-07 NOTE — Progress Notes (Signed)
Arrived at pt's room to insert PIV.Pt. said"I don't need an IV until in the morning. I don't want one tonight while I'm trying to sleep." Informed RN Fannie Knee. She said she will call IV team back in am ?

## 2021-04-07 NOTE — Interval H&P Note (Signed)
History and Physical Interval Note: ? ?04/07/2021 ?10:59 AM ? ?Dylan Ross  has presented today for surgery, with the diagnosis of stroke.  The various methods of treatment have been discussed with the patient and family. After consideration of risks, benefits and other options for treatment, the patient has consented to  Procedure(s): ?TRANSESOPHAGEAL ECHOCARDIOGRAM (TEE) (N/A) as a surgical intervention.  The patient's history has been reviewed, patient examined, no change in status, stable for surgery.  I have reviewed the patient's chart and labs.  Questions were answered to the patient's satisfaction.   ? ? ?[name] ? ? ?

## 2021-04-08 ENCOUNTER — Encounter (HOSPITAL_COMMUNITY): Payer: Self-pay | Admitting: Cardiology

## 2021-04-10 LAB — FACTOR 5 LEIDEN

## 2021-04-10 LAB — MTHFR DNA ANALYSIS

## 2021-05-06 ENCOUNTER — Telehealth: Payer: Self-pay

## 2021-05-06 NOTE — Telephone Encounter (Signed)
This patient reached out regarding a referral we were supposed to receive from the ED for his hospital f/u. Your next available appointment is currently end of June, and he was not sure if he was supposed to wait that long. He also stated that he was of blood thinners, states he was only given 21 days worth. He is unsure if he needs to still be on them.  ? ?Can you please advise if your next available appointment date of June 28th is okay for follow up, and If the patient still needs to be taking blood thinners? ? ?Thanks! ?

## 2021-06-10 ENCOUNTER — Inpatient Hospital Stay: Payer: Self-pay | Admitting: Neurology

## 2021-10-31 ENCOUNTER — Encounter: Payer: Self-pay | Admitting: *Deleted

## 2021-11-19 ENCOUNTER — Emergency Department (HOSPITAL_COMMUNITY)
Admission: EM | Admit: 2021-11-19 | Discharge: 2021-11-19 | Disposition: A | Discharge status: Home / Self Care | Payer: BLUE CROSS/BLUE SHIELD | Attending: Emergency Medicine | Admitting: Emergency Medicine

## 2021-11-19 ENCOUNTER — Emergency Department (HOSPITAL_COMMUNITY): Payer: BLUE CROSS/BLUE SHIELD

## 2021-11-19 ENCOUNTER — Other Ambulatory Visit: Payer: Self-pay

## 2021-11-19 ENCOUNTER — Encounter (HOSPITAL_COMMUNITY): Payer: Self-pay | Admitting: *Deleted

## 2021-11-19 DIAGNOSIS — Z794 Long term (current) use of insulin: Secondary | ICD-10-CM | POA: Diagnosis not present

## 2021-11-19 DIAGNOSIS — M5441 Lumbago with sciatica, right side: Secondary | ICD-10-CM | POA: Insufficient documentation

## 2021-11-19 DIAGNOSIS — I10 Essential (primary) hypertension: Secondary | ICD-10-CM | POA: Diagnosis not present

## 2021-11-19 DIAGNOSIS — Z79899 Other long term (current) drug therapy: Secondary | ICD-10-CM | POA: Diagnosis not present

## 2021-11-19 DIAGNOSIS — M5440 Lumbago with sciatica, unspecified side: Secondary | ICD-10-CM

## 2021-11-19 DIAGNOSIS — M545 Low back pain, unspecified: Secondary | ICD-10-CM | POA: Diagnosis present

## 2021-11-19 HISTORY — DX: Cerebral infarction, unspecified: I63.9

## 2021-11-19 LAB — URINALYSIS, ROUTINE W REFLEX MICROSCOPIC
Bacteria, UA: NONE SEEN
Bilirubin Urine: NEGATIVE
Glucose, UA: NEGATIVE mg/dL
Hgb urine dipstick: NEGATIVE
Ketones, ur: NEGATIVE mg/dL
Leukocytes,Ua: NEGATIVE
Nitrite: NEGATIVE
Protein, ur: 100 mg/dL — AB
Specific Gravity, Urine: 1.024 (ref 1.005–1.030)
pH: 6 (ref 5.0–8.0)

## 2021-11-19 MED ORDER — OXYCODONE-ACETAMINOPHEN 5-325 MG PO TABS: 1.0000 | ORAL_TABLET | Freq: Four times a day (QID) | ORAL | 0 refills | Status: AC | PRN

## 2021-11-19 MED ORDER — HYDROMORPHONE HCL 1 MG/ML IJ SOLN
1.0000 mg | Freq: Once | INTRAMUSCULAR | Status: AC
  Administered 2021-11-19: 1 mg via INTRAMUSCULAR
  Filled 2021-11-19: qty 1

## 2021-11-19 MED ORDER — IBUPROFEN 800 MG PO TABS: 800.0000 mg | ORAL_TABLET | Freq: Three times a day (TID) | ORAL | 1 refills | Status: AC

## 2021-11-19 NOTE — ED Provider Notes (Signed)
[facility] EMERGENCY DEPARTMENT Provider Note   CSN: [id number] Arrival date & time: 11/19/21  0746     History  Chief Complaint  Patient presents with   Back Pain    Dylan Ross is a 46 y.o. male.  Patient has a history of hypertension.  He has not taken his medicines today.  Patient complains of low back pain with radiating down his right leg  The history is provided by the patient and medical records.  Back Pain Location:  Generalized Quality:  Aching Radiates to:  R posterior upper leg Pain severity:  Moderate Onset quality:  Sudden Progression:  Worsening Chronicity:  Recurrent Context: not emotional stress   Relieved by:  Nothing Worsened by:  Nothing Associated symptoms: no abdominal pain, no chest pain and no headaches        Home Medications Prior to Admission medications   Medication Sig Start Date End Date Taking? Authorizing Provider  atenolol (TENORMIN) 100 MG tablet Take 1 tablet (100 mg total) by mouth daily. 04/07/21 11/19/21 Yes [place], Eric J, DO  fenofibrate 54 MG tablet Take 1 tablet (54 mg total) by mouth daily. 04/07/21 11/19/21 Yes [place], Eric J, DO  gabapentin (NEURONTIN) 600 MG tablet Take 600 mg by mouth 3 (three) times daily. 12/20/20  Yes [provider]  ibuprofen (ADVIL) 800 MG tablet Take 1 tablet (800 mg total) by mouth 3 (three) times daily. 11/19/21  Yes Bethann Berkshire, MD  lisinopril (ZESTRIL) 40 MG tablet Take 1 tablet (40 mg total) by mouth daily. 04/07/21 11/19/21 Yes [place], Eric J, DO  metFORMIN (GLUCOPHAGE) 1000 MG tablet Take 1 tablet (1,000 mg total) by mouth 2 (two) times daily. 04/07/21 11/19/21 Yes [place], Alvira Philips, DO  oxyCODONE-acetaminophen (PERCOCET/ROXICET) 5-325 MG tablet Take 1 tablet by mouth every 6 (six) hours as needed for severe pain. 11/19/21  Yes Bethann Berkshire, MD  rosuvastatin (CRESTOR) 20 MG tablet Take 1 tablet (20 mg total) by mouth at bedtime. 04/07/21 11/19/21 Yes [place], Eric J, DO  amLODipine  (NORVASC) 10 MG tablet Take 1 tablet (10 mg total) by mouth daily. Patient not taking: Reported on 11/19/2021 04/07/21 07/06/21  [place], Alvira Philips, DO  glipiZIDE (GLUCOTROL) 10 MG tablet Take 1 tablet (10 mg total) by mouth 2 (two) times daily before a meal. 04/07/21 07/06/21  [place], Alvira Philips, DO  hydrochlorothiazide (MICROZIDE) 12.5 MG capsule Take 2 capsules (25 mg total) by mouth daily. 04/07/21 07/06/21  [place], Alvira Philips, DO  insulin glargine (LANTUS) 100 UNIT/ML injection Inject 0.42 mLs (42 Units total) into the skin daily. 04/07/21 07/06/21  [place], Alvira Philips, DO  spironolactone (ALDACTONE) 50 MG tablet Take 1 tablet (50 mg total) by mouth daily. 04/07/21 07/06/21  [place], Eric J, DO      Allergies    Patient has no known allergies.    Review of Systems   Review of Systems  Constitutional:  Negative for appetite change and fatigue.  HENT:  Negative for congestion, ear discharge and sinus pressure.   Eyes:  Negative for discharge.  Respiratory:  Negative for cough.   Cardiovascular:  Negative for chest pain.  Gastrointestinal:  Negative for abdominal pain and diarrhea.  Genitourinary:  Negative for frequency and hematuria.  Musculoskeletal:  Positive for back pain.  Skin:  Negative for rash.  Neurological:  Negative for seizures and headaches.  Psychiatric/Behavioral:  Negative for hallucinations.     Physical Exam Updated Vital Signs BP (!) 182/120   Pulse 77   Temp 97.7  F (36.5 C) (Oral)   Resp 18   Ht 5\' 11"  (1.803 m)   Wt 122.5 kg   SpO2 98%   BMI 37.66 kg/m  Physical Exam Vitals and nursing note reviewed.  Constitutional:      Appearance: He is well-developed.  HENT:     Head: Normocephalic.     Nose: Nose normal.  Eyes:     General: No scleral icterus.    Conjunctiva/sclera: Conjunctivae normal.  Neck:     Thyroid: No thyromegaly.  Cardiovascular:     Rate and Rhythm: Normal rate and regular rhythm.     Heart sounds: No murmur heard.    No friction rub. No gallop.   Pulmonary:     Breath sounds: No stridor. No wheezing or rales.  Chest:     Chest wall: No tenderness.  Abdominal:     General: There is no distension.     Tenderness: There is no abdominal tenderness. There is no rebound.  Musculoskeletal:        General: Normal range of motion.     Cervical back: Neck supple.     Comments: Positive straight leg to the right  Lymphadenopathy:     Cervical: No cervical adenopathy.  Skin:    Findings: No erythema or rash.  Neurological:     Mental Status: He is alert and oriented to person, place, and time.     Motor: No abnormal muscle tone.     Coordination: Coordination normal.  Psychiatric:        Behavior: Behavior normal.     ED Results / Procedures / Treatments   Labs (all labs ordered are listed, but only abnormal results are displayed) Labs Reviewed  URINALYSIS, ROUTINE W REFLEX MICROSCOPIC - Abnormal; Notable for the following components:      Result Value   Protein, ur 100 (*)    All other components within normal limits    EKG None  Radiology DG Lumbar Spine Complete  Result Date: 11/19/2021 CLINICAL DATA:  Low back pain for 2 days. EXAM: LUMBAR SPINE - COMPLETE 4+ VIEW COMPARISON:  Lumbar spine radiographs 10/18/2011 FINDINGS: There are 5 non rib-bearing lumbar type vertebrae. There is chronic straightening of the normal lumbar lordosis without significant listhesis. No acute fracture is identified. A 1.4 cm left renal calculus is noted, larger than that on the prior study. There is mild multilevel degenerative endplate spurring throughout the lumbar spine which has progressed from the prior study, and there is new mild disc space narrowing at L2-3 and L4-5. IMPRESSION: 1. No acute osseous abnormality. 2. Progressive mild lumbar spondylosis. 3. Left nephrolithiasis. Electronically Signed   By: Logan Bores M.D.   On: 11/19/2021 08:56    Procedures Procedures    Medications Ordered in ED Medications  HYDROmorphone  (DILAUDID) injection 1 mg (1 mg Intramuscular Given 11/19/21 0851)    ED Course/ Medical Decision Making/ A&P   This patient presents to the ED for concern of back pain with pain and right leg, this involves an extensive number of treatment options, and is a complaint that carries with it a high risk of complications and morbidity.  The differential diagnosis includes sciatica, kidney stone   Co morbidities that complicate the patient evaluation  Hypertension   Additional history obtained:  Additional history obtained from patient External records from outside source obtained and reviewed including hospital records   Lab Tests:  I Ordered, and personally interpreted labs.  The pertinent results include: Urinalysis negative  Imaging Studies ordered:  I ordered imaging studies including lumbar studies I independently visualized and interpreted imaging which showed spondylosis I agree with the radiologist interpretation   Cardiac Monitoring: / EKG:  The patient was maintained on a cardiac monitor.  I personally viewed and interpreted the cardiac monitored which showed an underlying rhythm of: Normal sinus rhythm   Consultations Obtained:  No consultant  Problem List / ED Course / Critical interventions / Medication management  Back pain and hypertension I ordered medication including Dilaudid for pain Reevaluation of the patient after these medicines showed that the patient improved I have reviewed the patients home medicines and have made adjustments as needed   Social Determinants of Health:  None   Test / Admission - Considered:  None  Patient with back pain and sciatica down the right leg.  He is placed on Motrin and Percocet and will follow-up with Ortho.  Patient also has elevated blood pressure he is going to take his medicine today and follow-up with PCP        Final Clinical Impression(s) / ED Diagnoses Final diagnoses:  Acute right-sided low  back pain with sciatica, sciatica laterality unspecified    Rx / DC Orders ED Discharge Orders          Ordered    ibuprofen (ADVIL) 800 MG tablet  3 times daily        11/19/21 1042    oxyCODONE-acetaminophen (PERCOCET/ROXICET) 5-325 MG tablet  Every 6 hours PRN        11/19/21 1042              Milton Ferguson, MD 11/25/21 1153

## 2021-11-19 NOTE — ED Notes (Signed)
Patient transported to X-ray 

## 2021-11-19 NOTE — ED Triage Notes (Signed)
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

## 2021-11-19 NOTE — Discharge Instructions (Signed)
Take your blood pressure medicine today and get your blood pressure checked in the next couple weeks by your family doctor.  Follow-up with the orthopedic doctor next week for your back

## 2022-04-06 ENCOUNTER — Encounter: Payer: Self-pay | Admitting: *Deleted
